# Patient Record
Sex: Male | Born: 1965 | Race: Black or African American | Hispanic: No | Marital: Married | Smoking: Former smoker
Health system: Southern US, Community
[De-identification: ages and names within clinical notes are randomized; demographics above are authoritative.]

## PROBLEM LIST (undated history)

## (undated) DIAGNOSIS — L409 Psoriasis, unspecified: Secondary | ICD-10-CM

## (undated) DIAGNOSIS — E785 Hyperlipidemia, unspecified: Secondary | ICD-10-CM

## (undated) DIAGNOSIS — E119 Type 2 diabetes mellitus without complications: Secondary | ICD-10-CM

## (undated) DIAGNOSIS — G4730 Sleep apnea, unspecified: Secondary | ICD-10-CM

## (undated) HISTORY — PX: TONSILECTOMY, ADENOIDECTOMY, BILATERAL MYRINGOTOMY AND TUBES: SHX2538

## (undated) HISTORY — DX: Hyperlipidemia, unspecified: E78.5

## (undated) HISTORY — DX: Psoriasis, unspecified: L40.9

## (undated) HISTORY — DX: Sleep apnea, unspecified: G47.30

## (undated) HISTORY — PX: HERNIA REPAIR: SHX51

---

## 2013-08-01 DIAGNOSIS — Z1211 Encounter for screening for malignant neoplasm of colon: Secondary | ICD-10-CM | POA: Insufficient documentation

## 2013-09-16 DIAGNOSIS — M5030 Other cervical disc degeneration, unspecified cervical region: Secondary | ICD-10-CM | POA: Insufficient documentation

## 2015-03-15 DIAGNOSIS — R7303 Prediabetes: Secondary | ICD-10-CM | POA: Insufficient documentation

## 2018-10-21 DIAGNOSIS — E669 Obesity, unspecified: Secondary | ICD-10-CM | POA: Insufficient documentation

## 2019-01-12 ENCOUNTER — Encounter (INDEPENDENT_AMBULATORY_CARE_PROVIDER_SITE_OTHER): Payer: Self-pay | Admitting: Nurse Practitioner

## 2019-01-12 ENCOUNTER — Ambulatory Visit (INDEPENDENT_AMBULATORY_CARE_PROVIDER_SITE_OTHER): Payer: BLUE CROSS/BLUE SHIELD | Admitting: Nurse Practitioner

## 2019-01-12 VITALS — BP 136/87 | HR 80 | Temp 97.0°F | Resp 16 | Ht 65.0 in | Wt 195.0 lb

## 2019-01-12 DIAGNOSIS — M542 Cervicalgia: Secondary | ICD-10-CM

## 2019-01-12 MED ORDER — NAPROXEN 500 MG PO TABS
500.00 mg | ORAL_TABLET | Freq: Two times a day (BID) | ORAL | 0 refills | Status: AC
Start: 2019-01-12 — End: 2019-03-13

## 2019-01-12 MED ORDER — METHOCARBAMOL 750 MG PO TABS
750.00 mg | ORAL_TABLET | Freq: Every evening | ORAL | 0 refills | Status: AC | PRN
Start: 2019-01-12 — End: 2019-01-19

## 2019-01-12 NOTE — Patient Instructions (Signed)
Dear Michael Gardner:    Thank you for choosing [facility]  [facility]  [address]  [address]   [place] [address]  [phone].        I hope your visit today was EXCELLENT.     Specific instructions for your visit today:    Follow up with your primary care physician for ongoing care.  Please go to the ER for worsening symptoms or concerns.  Please read all of your discharge instructions     You have been evaluated for a pulled muscle / spasm in left neck  You can try to use Arnica Gel , tiger balm or icy hot on the sore areas   Use the Naproxen twice daily with food for the inflammation/pain  You may use ice to the area alternating with heat  Use the muscle relaxant for the spasms - DO NOT drive, drink alcohol or work when taking this medication, it will make you very sleepy    Please follow-up with the PCP or an orthopedist if the pain continues.      Understanding Cervical Strain    There are 7 bones (vertebrae) in the neck that are part of the spine. These are called the cervical spine. Cervical strain is a medical term for neck pain. The neck has several layers of muscles. These are connected with tendons to the cervical spine and other bones. Neck pain is often the result of injury to these muscles and tendons.   Causes of cervical strain  Different types of stress on the neck can damage muscles and tendons (soft tissues) and cause cervical strain. Cervical tissues can be damaged by:    The neck being forced past its normal range of motion, such as in a car accident or sports injury   Constant, low-level stress, such as from poor posture or a poorly set-up workspace  Symptoms of cervical strain  These may include:   Neck pain or stiffness   Pain in the shoulders or upper back   Muscle spasms   Headache, often starting at the base of the neck   Irritability, trouble concentrating, or sleeplessness   Numbness in the arm or hand   Tingling or weakness in the arm   Treatment for cervical strain  This problem often gets better on its own. Treatments aim to reduce pain and inflammation and increase the range of motion of the neck. Possible treatments include:    Over-the-counter or prescription medicine. These help relieve pain and inflammation.   Muscle relaxant can help with muscle spasms.   Stretching exercises to decrease neck stiffness.   Massage to decrease neck stiffness.   Cold or heat pack. These help reduce pain and swelling.  Call 911  Call 911 right away if you have any of these:    Face drooping or numbness   Numbness or weakness, especially in the arms or on one side   Slurred speech or difficulty speaking   Blurred vision  When to call your healthcare provider  Call your healthcare provider right away if you have any of these:   Fever of 100.71F (38C) or higher, or as directed by your provider   Chills   Pain or stiffness that gets worse   Symptoms that dont get better, or get worse   Numbness, tingling, weakness or shooting pains into the arms or legs   New symptoms  [employer] last reviewed this educational content on 11/14/2017   2000-2020 The  [employer]. [address] [place], [place] [address]. All rights reserved. This information is not intended as a substitute for professional medical care. Always follow your healthcare professional's instructions.            If you do not continue to improve or your condition worsens, please contact your doctor or GO immediAtely to the Emergency Department.    Sincerely,  Janice Coffin, FNP-BC  Family Nurse Practitioner

## 2019-01-12 NOTE — Progress Notes (Signed)
[facility]  PROGRESS NOTE     Patient: Michael Gardner   Date of Service: 01/12/2019   MRN: [id number]       Praneel Haisley is a 53 y.o. male      HISTORY     Chief Complaint   Patient presents with    Neck Pain     Reports pain for the last 2 weeks, > left side, thinks may have slept wrong, Using tylenol / Tylenol with Codeine/ muscle relaxants        Patient report left sided nec pain for the last 2 weeks  Patient thinks he may have slept wrong as he denies injury or trauma to the area  Tried tylnlenol, T#3, muscle relaxants - reprots all meds were old        History provided by:  Patient  Language interpreter used: No    Neck Pain    This is a new problem. The current episode started 1 to 4 weeks ago. The problem occurs constantly. The problem has been unchanged. The pain is associated with a sleep position. The pain is present in the left side. The quality of the pain is described as aching. The pain is at a severity of 4/10. The pain is mild. Nothing aggravates the symptoms. The pain is same all the time. Pertinent negatives include no chest pain, fever, headaches, photophobia or visual change. He has tried muscle relaxants and NSAIDs for the symptoms. The treatment provided mild relief.       Review of Systems   Constitutional: Negative for chills and fever.   HENT: Negative.    Eyes: Negative.  Negative for photophobia.   Respiratory: Negative for cough, chest tightness, shortness of breath and wheezing.    Cardiovascular: Negative for chest pain and palpitations.   Gastrointestinal: Negative for abdominal pain, diarrhea, nausea and vomiting.   Endocrine: Negative.    Genitourinary: Negative.    Musculoskeletal: Positive for myalgias and neck pain.   Skin: Negative for rash.   Allergic/Immunologic: Negative for immunocompromised state.   Neurological: Negative.  Negative for headaches.   Hematological: Does not bruise/bleed easily.   Psychiatric/Behavioral: Negative.        History:  Past Medical History:    Diagnosis Date    Hyperlipidemia     Sleep apnea        Past Surgical History:   Procedure Laterality Date    TONSILECTOMY, ADENOIDECTOMY, BILATERAL MYRINGOTOMY AND TUBES      UVULECTOMY  2006       History reviewed. No pertinent family history.    Social History     Tobacco Use    Smoking status: Never Smoker    Smokeless tobacco: Never Used   Substance Use Topics    Alcohol use: Yes     Alcohol/week: 7.0 standard drinks     Types: 7 Cans of beer per week    Drug use: Never       History reviewed.        Current Outpatient Medications:     acyclovir (ZOVIRAX) 400 MG tablet, Take 400 mg by mouth, Disp: , Rfl:     aspirin EC 81 MG EC tablet, Take by mouth, Disp: , Rfl:     atorvastatin (Lipitor) 80 MG tablet, 1 tab(s), Disp: , Rfl:     fenofibrate (LOFIBRA) 160 MG tablet, Take 160 mg by mouth, Disp: , Rfl:     hydrocortisone 2.5 % cream, Apply topically, Disp: , Rfl:  ketoconazole (NIZORAL) 2 % shampoo, Use to wash scalp as directed, Disp: , Rfl:     methocarbamol (ROBAXIN) 750 MG tablet, Take 1 tablet (750 mg total) by mouth nightly as needed (spasm), Disp: 15 tablet, Rfl: 0    naproxen (NAPROSYN) 500 MG tablet, Take 1 tablet (500 mg total) by mouth 2 (two) times daily with meals, Disp: 30 tablet, Rfl: 0    No Known Allergies    Medications and Allergies reviewed.    PHYSICAL EXAM     Vitals:    01/12/19 1712   BP: 136/87   Pulse: 80   Resp: 16   Temp: 97 F (36.1 C)   SpO2: 97%   Weight: 88.5 kg (195 lb)   Height: 1.651 m (5\' 5" )       Physical Exam   Nursing note and vitals reviewed.  Constitutional: He is oriented to person, place, and time. He appears well-developed and well-nourished. No distress.   HENT:   Head: Normocephalic and atraumatic.   Eyes: Pupils are equal, round, and reactive to light.   Neck: Muscular tenderness present. No spinous process tenderness present. No neck rigidity. Decreased range of motion present. No edema present.       Cardiovascular: Normal rate, regular rhythm,  S1 normal, S2 normal and normal heart sounds. Exam reveals no gallop and no friction rub.   No murmur heard.  Pulmonary/Chest: Effort normal and breath sounds normal. No respiratory distress. He has no wheezes. He has no rhonchi. He has no rales.   Musculoskeletal:         General: Tenderness present. No deformity or edema.   Lymphadenopathy:     He has no cervical adenopathy.   Neurological: He is alert and oriented to person, place, and time.   Skin: Skin is warm and dry. No rash noted.   Psychiatric: He has a normal mood and affect. His behavior is normal. Judgment and thought content normal.         UCC COURSE       Results     ** No results found for the last 24 hours. **            No results found.      Orders Placed This Encounter   Medications    naproxen (NAPROSYN) 500 MG tablet     Sig: Take 1 tablet (500 mg total) by mouth 2 (two) times daily with meals     Dispense:  30 tablet     Refill:  0    methocarbamol (ROBAXIN) 750 MG tablet     Sig: Take 1 tablet (750 mg total) by mouth nightly as needed (spasm)     Dispense:  15 tablet     Refill:  0         PROCEDURES     Procedures       ASSESSMENT     Encounter Diagnosis   Name Primary?    Cervicalgia Yes          SSESSMENT    PLAN     Kiyon was seen today for neck pain.    Diagnoses and all orders for this visit:    Cervicalgia  -     naproxen (NAPROSYN) 500 MG tablet; Take 1 tablet (500 mg total) by mouth 2 (two) times daily with meals  -     methocarbamol (ROBAXIN) 750 MG tablet; Take 1 tablet (750 mg total) by mouth nightly as needed (spasm)  Heat   Arnica    PCP follow up as needed       Discussed results and diagnosis with patient/family.  Reviewed warning signs for worsening condition, as well as, indications for follow-up with pmd and return to urgent care clinic.   Patient/family expressed understanding of instructions.    No orders of the defined types were placed in this encounter.        An After Visit Summary was printed and given to  the patient.      Signed,  Rondall Allegra, FNP

## 2019-03-22 ENCOUNTER — Encounter (INDEPENDENT_AMBULATORY_CARE_PROVIDER_SITE_OTHER): Payer: Self-pay | Admitting: Internal Medicine

## 2019-03-22 ENCOUNTER — Ambulatory Visit (INDEPENDENT_AMBULATORY_CARE_PROVIDER_SITE_OTHER): Payer: BLUE CROSS/BLUE SHIELD | Admitting: Internal Medicine

## 2019-03-22 VITALS — BP 126/80 | HR 86 | Temp 96.9°F | Ht 65.0 in | Wt 202.2 lb

## 2019-03-22 DIAGNOSIS — Z0000 Encounter for general adult medical examination without abnormal findings: Secondary | ICD-10-CM

## 2019-03-22 DIAGNOSIS — Z23 Encounter for immunization: Secondary | ICD-10-CM

## 2019-03-22 DIAGNOSIS — E782 Mixed hyperlipidemia: Secondary | ICD-10-CM

## 2019-03-22 DIAGNOSIS — Z1211 Encounter for screening for malignant neoplasm of colon: Secondary | ICD-10-CM

## 2019-03-22 MED ORDER — ATORVASTATIN CALCIUM 80 MG PO TABS
ORAL_TABLET | ORAL | 1 refills | Status: DC
Start: 2019-03-22 — End: 2019-09-19

## 2019-03-22 MED ORDER — FENOFIBRATE 160 MG PO TABS
160.0000 mg | ORAL_TABLET | Freq: Every day | ORAL | 1 refills | Status: DC
Start: 2019-03-22 — End: 2019-09-19

## 2019-03-22 NOTE — Progress Notes (Addendum)
Subjective:       Patient ID: Michael Gardner is a 53 y.o. male.  Chief Complaint   Patient presents with    Annual Exam    Hyperlipidemia     HPI:    53 yrs old male came in for an annual physical exam. He denies chest pain, palpitations, shortness of breath, dizziness, headache or joint pains. He has no abdominal or urinary complaints. No change in bowel habits. He denies any change in activity or diet and exercises few times a week.    Hyperlipidemia  The patient is being seen for an initial evaluation of an existing diagnosis of hyperlipidemia. Hyperlipidemia is classified as elevated cholesterol with elevated triglycerides. There are no associated comorbid illnesses. There are no recent interval events. There is no interval history of myalgias, chest pain and dyspnea. Current therapy includes a statin, lifestyle changes and a fibrate.  Patient is compliant with the current regimen. The LDL is at goal, with medication.  The HDL is above goal, with medication. The triglycerides are above goal, with medication. Lifestyle changes have included improved nutrition.  The patient reports exercising 30 min/day, 3-4 days/week.  The patient's exercise routine consists of walking.        Past Medical History:   Diagnosis Date    Hyperlipidemia     Sleep apnea        Past Surgical History:   Procedure Laterality Date    TONSILECTOMY, ADENOIDECTOMY, BILATERAL MYRINGOTOMY AND TUBES      UVULECTOMY  2006       Family History   Problem Relation Age of Onset    No known problems Mother     COPD Father        Social History     Socioeconomic History    Marital status: Married     Spouse name: Not on file    Number of children: Not on file    Years of education: Not on file    Highest education level: Not on file   Occupational History    Not on file   Social Needs    Financial resource strain: Not on file    Food insecurity     Worry: Not on file     Inability: Not on file    Transportation needs     Medical: Not on  file     Non-medical: Not on file   Tobacco Use    Smoking status: Never Smoker    Smokeless tobacco: Never Used   Substance and Sexual Activity    Alcohol use: Yes     Alcohol/week: 7.0 standard drinks     Types: 7 Cans of beer per week    Drug use: Never    Sexual activity: Not on file   Lifestyle    Physical activity     Days per week: Not on file     Minutes per session: Not on file    Stress: Not on file   Relationships    Social connections     Talks on phone: Not on file     Gets together: Not on file     Attends religious service: Not on file     Active member of club or organization: Not on file     Attends meetings of clubs or organizations: Not on file     Relationship status: Not on file    Intimate partner violence     Fear of current or ex partner: Not  on file     Emotionally abused: Not on file     Physically abused: Not on file     Forced sexual activity: Not on file   Other Topics Concern    Not on file   Social History Narrative    Not on file       No Known Allergies    Current Outpatient Medications   Medication Sig Dispense Refill    aspirin EC 81 MG EC tablet Take by mouth      atorvastatin (Lipitor) 80 MG tablet 1 tab(s) 90 tablet 1    fenofibrate (LOFIBRA) 160 MG tablet Take 1 tablet (160 mg total) by mouth daily 90 tablet 1    hydrocortisone 2.5 % cream Apply topically       No current facility-administered medications for this visit.          Review of Systems  Constitutional: Negative for activity change, appetite change, fatigue and unexpected weight change.   HENT: Negative.    Eyes: Negative.    Respiratory: Negative for chest tightness, shortness of breath and wheezing.    Cardiovascular: Negative for chest pain and palpitations.   Gastrointestinal: Negative for nausea, vomiting, abdominal pain, constipation and blood in stool.   Genitourinary: Negative.    Musculoskeletal: Negative for myalgias, joint swelling and arthralgias.   Skin: Negative.    Neurological:  Negative.    Vitals:    03/22/19 0820   BP: 126/80   BP Site: Left arm   Patient Position: Sitting   Cuff Size: Large   Pulse: 86   Temp: (!) 96.9 F (36.1 C)   TempSrc: Temporal   Weight: 91.7 kg (202 lb 3.2 oz)   Height: 1.651 m (5\' 5" )           Objective:    Physical Exam  Constitutional: Patient is oriented to person, place, and time and appears well-developed and well-nourished.   HENT: Head: Normocephalic.        Right Ear: External ear normal.        Left Ear: External ear normal.        Mouth/Throat: No pharyngeal erythema, oral ulcers or pharyngeal exudate.   Eyes: Conjunctivae normal and EOM are normal. Pupils are equal, round, and reactive to light. No scleral icterus.   Neck: Normal range of motion. Neck supple. No JVD present. No thyromegaly present.   Cardiovascular: Normal rate, regular rhythm, normal heart sounds and intact distal pulses.                             Exam reveals no gallop. No murmur heard.  Pulmonary/Chest: Effort normal and breath sounds normal. No wheezes and no rales.   Abdominal: Soft. Bowel sounds are normal, no distension.                     There is no tenderness. No hepatospleenomegaly   Genitourinary:  Testis / Penis - normal.   Musculoskeletal: No edema feet.           Normal range of motion of all joints. No joint swelling noted.  Lymphadenopathy:            No cervical, axillary or inguinal lymphadenopathy.   Neurological: Alert and oriented to person, place, and time. No cranial nerve deficit.  Normal muscle tone & coordination. No apparent sensory loss.   Skin: Skin is warm and dry. No rash noted. No erythema.        Assessment:       1. ANNUAL PHYSICAL EXAM - ECG : Normal Sinus Rhythm, Within Normal Limits  2. Mixed Hyperlipidemia  3. Need for Flu Vaccine  4. Colon Cancer Screening      Plan:       1. Normal Physical Exam.   Labs: CBC, CMP, Lipids, HbA1c, TSH - sent.  2. Discussed healthy diet with more of vegetables, fruits and nuts and  emphasized regular exercise 40-45 minutes / day at least 4-5 /week.  3. Vaccination status - Flu Vaccine given.  4. Suggest Vit D 2000 units daily.  5. Needs colonoscopy - referral given  6. Continue Lipitor 80 mg daily HS and Fenofibrate 160 mg daily  7. May need to reduce the dose of Lipitor to 40 mg if LDL is low                      Procedures

## 2019-03-22 NOTE — Progress Notes (Signed)
Have you seen any specialists/other providers since your last visit with us?    No    Arm preference verified?   Yes    The patient is due for nothing at this time, HM is up-to-date.

## 2019-03-23 ENCOUNTER — Telehealth: Payer: Self-pay

## 2019-03-23 ENCOUNTER — Ambulatory Visit (INDEPENDENT_AMBULATORY_CARE_PROVIDER_SITE_OTHER): Payer: BLUE CROSS/BLUE SHIELD | Admitting: Internal Medicine

## 2019-03-23 LAB — CBC AND DIFFERENTIAL
Baso(Absolute): 0 10*3/uL (ref 0.0–0.2)
Basos: 0 %
Eos: 7 %
Eosinophils Absolute: 0.3 10*3/uL (ref 0.0–0.4)
Hematocrit: 44.2 % (ref 37.5–51.0)
Hemoglobin: 14.8 g/dL (ref 13.0–17.7)
Immature Granulocytes Absolute: 0 10*3/uL (ref 0.0–0.1)
Immature Granulocytes: 1 %
Lymphocytes Absolute: 1.4 10*3/uL (ref 0.7–3.1)
Lymphocytes: 37 %
MCH: 26.6 pg (ref 26.6–33.0)
MCHC: 33.5 g/dL (ref 31.5–35.7)
MCV: 79 fL (ref 79–97)
Monocytes Absolute: 0.3 10*3/uL (ref 0.1–0.9)
Monocytes: 9 %
Neutrophils Absolute: 1.8 10*3/uL (ref 1.4–7.0)
Neutrophils: 46 %
Platelets: 215 10*3/uL (ref 150–450)
RBC: 5.57 x10E6/uL (ref 4.14–5.80)
RDW: 15.7 % — ABNORMAL HIGH (ref 11.6–15.4)
WBC: 3.8 10*3/uL (ref 3.4–10.8)

## 2019-03-23 LAB — LIPID PANEL
Cholesterol / HDL Ratio: 23.8 ratio — ABNORMAL HIGH (ref 0.0–5.0)
Cholesterol: 333 mg/dL — ABNORMAL HIGH (ref 100–199)
HDL: 14 mg/dL — ABNORMAL LOW (ref >39)
Triglycerides: 2042 mg/dL (ref 0–149)

## 2019-03-23 LAB — COMPREHENSIVE METABOLIC PANEL
ALT: 23 IU/L (ref 0–44)
AST (SGOT): 29 IU/L (ref 0–40)
Albumin/Globulin Ratio: 1.3 (ref 1.2–2.2)
Albumin: 4.1 g/dL (ref 3.8–4.9)
Alkaline Phosphatase: 72 IU/L (ref 39–117)
BUN / Creatinine Ratio: 13 (ref 9–20)
BUN: 15 mg/dL (ref 6–24)
Bilirubin, Total: 0.3 mg/dL (ref 0.0–1.2)
CO2: 20 mmol/L (ref 20–29)
Calcium: 9.2 mg/dL (ref 8.7–10.2)
Chloride: 97 mmol/L (ref 96–106)
Creatinine: 1.19 mg/dL (ref 0.76–1.27)
EGFR: 70 mL/min/{1.73_m2} (ref >59)
EGFR: 81 mL/min/{1.73_m2} (ref >59)
Globulin, Total: 3.1 g/dL (ref 1.5–4.5)
Glucose: 97 mg/dL (ref 65–99)
Potassium: 4.2 mmol/L (ref 3.5–5.2)
Protein, Total: 7.2 g/dL (ref 6.0–8.5)
Sodium: 136 mmol/L (ref 134–144)

## 2019-03-23 LAB — HEMOGLOBIN A1C: Hemoglobin A1C: 5.9 % — ABNORMAL HIGH (ref 4.8–5.6)

## 2019-03-23 LAB — TSH: TSH: 0.745 u[IU]/mL (ref 0.450–4.500)

## 2019-03-23 NOTE — Telephone Encounter (Signed)
Dr Brent Bulla spoke with patient regarding lab results.

## 2019-03-23 NOTE — Telephone Encounter (Signed)
Patient is returning call from Dr. Brent Bulla. Please return call at noted phone number below.      Patient Preferred Callback Number: [phone]

## 2019-07-01 ENCOUNTER — Encounter (INDEPENDENT_AMBULATORY_CARE_PROVIDER_SITE_OTHER): Payer: Self-pay | Admitting: Internal Medicine

## 2019-07-01 ENCOUNTER — Ambulatory Visit (INDEPENDENT_AMBULATORY_CARE_PROVIDER_SITE_OTHER): Payer: BLUE CROSS/BLUE SHIELD | Admitting: Internal Medicine

## 2019-07-01 VITALS — BP 125/80 | HR 78 | Temp 97.3°F | Ht 65.0 in | Wt 199.0 lb

## 2019-07-01 DIAGNOSIS — K429 Umbilical hernia without obstruction or gangrene: Secondary | ICD-10-CM

## 2019-07-01 DIAGNOSIS — E781 Pure hyperglyceridemia: Secondary | ICD-10-CM

## 2019-07-01 DIAGNOSIS — Z23 Encounter for immunization: Secondary | ICD-10-CM

## 2019-07-01 DIAGNOSIS — R7303 Prediabetes: Secondary | ICD-10-CM

## 2019-07-01 NOTE — Progress Notes (Signed)
Have you seen any specialists/other providers since your last visit with us?    No    Arm preference verified?   Yes    The patient is due for shingles vaccine and Advance Directive

## 2019-07-01 NOTE — Progress Notes (Signed)
Subjective:       Patient ID: Michael Gardner is a 54 y.o. male.  Chief Complaint   Patient presents with    Hyperlipidemia     follow up visit        Hyperlipidemia  The patient is being seen for a routine follow-up of hyperlipidemia. Hyperlipidemia is classified as. Hypertriglyceridemia  Comorbid illnesses include obesity.  There are no recent interval events. There is no interval history of myalgias, chest pain and dyspnea. Current therapy includes a statin, lifestyle changes and a fibrate.  The LDL is above goal.  The HDL is at goal. The triglycerides are above goal. Lifestyle changes have included improved nutrition and increased exercise.  The patient reports exercising 45 min/day, 5-7 days/week.  The patient's exercise routine consists of walking.    He had elevated A1c last time. He denies polyuria, thirst, fatigue, leg aches or weight loss. He has been on low carbohydrate diet for last 3 months    The following portions of the patient's history were reviewed and updated as appropriate: allergies, current medications, past family history, past medical history, past social history, past surgical history and problem list.    Review of Systems   Constitutional: Negative for fatigue and unexpected weight change.   Respiratory: Negative for shortness of breath.    Cardiovascular: Negative for chest pain and leg swelling.   Musculoskeletal: Negative for arthralgias and myalgias.     Vitals:    07/01/19 0817   BP: 125/80   BP Site: Left arm   Patient Position: Sitting   Cuff Size: Large   Pulse: 78   Temp: 97.3 F (36.3 C)   TempSrc: Temporal   Weight: 90.3 kg (199 lb)   Height: 1.651 m (5\' 5" )             Objective:    Physical Exam  Vitals signs reviewed.   Constitutional:       General: He is not in acute distress.     Appearance: Normal appearance.   Cardiovascular:      Rate and Rhythm: Normal rate and regular rhythm.      Heart sounds: Normal heart sounds. No murmur. No gallop.    Pulmonary:      Effort:  Pulmonary effort is normal.      Breath sounds: Normal breath sounds. No wheezing or rales.   Abdominal:      General: Bowel sounds are normal. There is no distension.      Palpations: Abdomen is soft.      Tenderness: There is no abdominal tenderness.      Comments: Small reducible umbilical hernia with divarication of Recti noted.               Assessment:       1. Hypertriglyceridemia.  2. Prediabetes  3. Umbilical Hernia  4. Need for Herpes Zoster Vaccine      Plan:       1. Lipitor 80 mg HS and Fenofibrate 160 mg daily - continue.  2. Strict low fat diet, no fired foods. Reduce sugar and sweets.  3. Regular aerobic exercise  4. Need to see Gen. Surgery - for hernia repair - referral given.  5. Labs: CBC, CMP, Lipids and HbA1c - sent  6. Herpes Zoster vaccine first dose given                              Procedures

## 2019-07-02 LAB — CBC AND DIFFERENTIAL
Baso(Absolute): 0 10*3/uL (ref 0.0–0.2)
Basos: 1 %
Eos: 6 %
Eosinophils Absolute: 0.3 10*3/uL (ref 0.0–0.4)
Hematocrit: 47 % (ref 37.5–51.0)
Hemoglobin: 14.6 g/dL (ref 13.0–17.7)
Immature Granulocytes Absolute: 0 10*3/uL (ref 0.0–0.1)
Immature Granulocytes: 0 %
Lymphocytes Absolute: 1.8 10*3/uL (ref 0.7–3.1)
Lymphocytes: 46 %
MCH: 25 pg — ABNORMAL LOW (ref 26.6–33.0)
MCHC: 31.1 g/dL — ABNORMAL LOW (ref 31.5–35.7)
MCV: 80 fL (ref 79–97)
Monocytes Absolute: 0.4 10*3/uL (ref 0.1–0.9)
Monocytes: 9 %
Neutrophils Absolute: 1.5 10*3/uL (ref 1.4–7.0)
Neutrophils: 38 %
Platelets: 183 10*3/uL (ref 150–450)
RBC: 5.85 x10E6/uL — ABNORMAL HIGH (ref 4.14–5.80)
RDW: 15.7 % — ABNORMAL HIGH (ref 11.6–15.4)
WBC: 3.9 10*3/uL (ref 3.4–10.8)

## 2019-07-02 LAB — LIPID PANEL
Cholesterol / HDL Ratio: 7.6 ratio — ABNORMAL HIGH (ref 0.0–5.0)
Cholesterol: 152 mg/dL (ref 100–199)
HDL: 20 mg/dL — ABNORMAL LOW (ref >39)
LDL Chol Calculated (NIH): 45 mg/dL (ref 0–99)
Triglycerides: 609 mg/dL (ref 0–149)
VLDL Calculated: 87 mg/dL — ABNORMAL HIGH (ref 5–40)

## 2019-07-02 LAB — COMPREHENSIVE METABOLIC PANEL
ALT: 25 IU/L (ref 0–44)
AST (SGOT): 23 IU/L (ref 0–40)
Albumin/Globulin Ratio: 1.5 (ref 1.2–2.2)
Albumin: 4.4 g/dL (ref 3.8–4.9)
Alkaline Phosphatase: 77 IU/L (ref 39–117)
BUN / Creatinine Ratio: 13 (ref 9–20)
BUN: 15 mg/dL (ref 6–24)
Bilirubin, Total: 0.4 mg/dL (ref 0.0–1.2)
CO2: 24 mmol/L (ref 20–29)
Calcium: 9.6 mg/dL (ref 8.7–10.2)
Chloride: 104 mmol/L (ref 96–106)
Creatinine: 1.16 mg/dL (ref 0.76–1.27)
EGFR: 71 mL/min/{1.73_m2} (ref >59)
EGFR: 83 mL/min/{1.73_m2} (ref >59)
Globulin, Total: 2.9 g/dL (ref 1.5–4.5)
Glucose: 105 mg/dL — ABNORMAL HIGH (ref 65–99)
Potassium: 4.4 mmol/L (ref 3.5–5.2)
Protein, Total: 7.3 g/dL (ref 6.0–8.5)
Sodium: 142 mmol/L (ref 134–144)

## 2019-07-02 LAB — HEMOGLOBIN A1C: Hemoglobin A1C: 5.8 % — ABNORMAL HIGH (ref 4.8–5.6)

## 2019-07-04 ENCOUNTER — Encounter (INDEPENDENT_AMBULATORY_CARE_PROVIDER_SITE_OTHER): Payer: Self-pay | Admitting: Internal Medicine

## 2019-07-20 ENCOUNTER — Encounter (INDEPENDENT_AMBULATORY_CARE_PROVIDER_SITE_OTHER): Payer: Self-pay

## 2019-07-28 ENCOUNTER — Encounter (INDEPENDENT_AMBULATORY_CARE_PROVIDER_SITE_OTHER): Payer: Self-pay

## 2019-07-28 ENCOUNTER — Encounter (INDEPENDENT_AMBULATORY_CARE_PROVIDER_SITE_OTHER): Payer: Self-pay | Admitting: Internal Medicine

## 2019-07-29 ENCOUNTER — Encounter (INDEPENDENT_AMBULATORY_CARE_PROVIDER_SITE_OTHER): Payer: Self-pay

## 2019-08-04 ENCOUNTER — Encounter (INDEPENDENT_AMBULATORY_CARE_PROVIDER_SITE_OTHER): Payer: Self-pay

## 2019-08-24 ENCOUNTER — Encounter (INDEPENDENT_AMBULATORY_CARE_PROVIDER_SITE_OTHER): Payer: Self-pay

## 2019-08-25 ENCOUNTER — Encounter (INDEPENDENT_AMBULATORY_CARE_PROVIDER_SITE_OTHER): Payer: Self-pay

## 2019-08-30 ENCOUNTER — Ambulatory Visit (INDEPENDENT_AMBULATORY_CARE_PROVIDER_SITE_OTHER): Payer: BLUE CROSS/BLUE SHIELD

## 2019-08-30 DIAGNOSIS — Z23 Encounter for immunization: Secondary | ICD-10-CM

## 2019-08-30 NOTE — Progress Notes (Signed)
COVID-19 Vaccination:  Michael Gardner   [birth year]  08/30/2019     Patient presented to the office for the following COVID-19 vaccine administration:    [x]  Jansen/J+J (single dose)  []  Pfizer-BioNTech  []  Moderna      []  Dose #1   []  Dose #2   (Both doses of the series should be completed with the same product.)    [x]  A verbal consent has been obtained from patient to administer COVID-19 vaccine that is currently being offered under Emergency Use Authorization (EUA). Patient/Caregiver has been provided a copy of EUA.      [x]  Pt has not received other non-COVID vaccinations within 14 days of administration of COVID-19 vaccination.  [x]  Pt was screened for precautions and contraindications to vaccine  []  24-28+ day interval observed between first and second dose (Moderna)  []  17-21+ day interval observed between first and second dose (Pfizer-BioNTech)

## 2019-08-31 ENCOUNTER — Ambulatory Visit (INDEPENDENT_AMBULATORY_CARE_PROVIDER_SITE_OTHER): Payer: BLUE CROSS/BLUE SHIELD | Admitting: Internal Medicine

## 2019-09-01 ENCOUNTER — Encounter (INDEPENDENT_AMBULATORY_CARE_PROVIDER_SITE_OTHER): Payer: Self-pay

## 2019-09-18 ENCOUNTER — Other Ambulatory Visit (INDEPENDENT_AMBULATORY_CARE_PROVIDER_SITE_OTHER): Payer: Self-pay | Admitting: Internal Medicine

## 2019-09-18 DIAGNOSIS — E782 Mixed hyperlipidemia: Secondary | ICD-10-CM

## 2019-10-21 ENCOUNTER — Encounter (INDEPENDENT_AMBULATORY_CARE_PROVIDER_SITE_OTHER): Payer: Self-pay | Admitting: Internal Medicine

## 2019-10-24 ENCOUNTER — Ambulatory Visit (INDEPENDENT_AMBULATORY_CARE_PROVIDER_SITE_OTHER): Payer: BLUE CROSS/BLUE SHIELD | Admitting: Nurse Practitioner

## 2019-10-24 ENCOUNTER — Encounter (INDEPENDENT_AMBULATORY_CARE_PROVIDER_SITE_OTHER): Payer: Self-pay | Admitting: Nurse Practitioner

## 2019-10-24 VITALS — BP 120/83 | HR 104 | Temp 97.8°F | Wt 198.2 lb

## 2019-10-24 DIAGNOSIS — R7303 Prediabetes: Secondary | ICD-10-CM

## 2019-10-24 DIAGNOSIS — R000 Tachycardia, unspecified: Secondary | ICD-10-CM

## 2019-10-24 NOTE — Progress Notes (Signed)
Subjective:      Patient ID: Michael Gardner is a 54 y.o. male.    Chief Complaint:  Chief Complaint   Patient presents with    Palpitations     headache x1 week       HPI:  Pt reports this am he woke up this morning to do the treadmill. Pt had exercised one time in the last 5 months.  Pt started to run for two minutes at 4.5 speed with no elevation and heart rate elevated to 170 using handles on the treadmill. Pt stayed on the treadmill and slowed down, and heart rate decreased 107-110. Pt got off treadmill took a shower and had a fruit smoothy and took heart rate on pulse ox and heart rate fluctated.  Pt reports that he has slight headaches, head feels "tight", for the last couple months, improves slightly when taking allergy medication.    Pt reports that he was drinking water while exercising. Pt had not had any caffeine prior to exercise. Pt has been taking sudafed daily for allergies in addition to allegra  Pt reports that he has been getting up two times per night to urinate which is an increase in frequency.     Reviewed labs from 06/2019, pt had an HA1C 5.8  Vitals (Trend):  BP Readings from Last 3 Encounters:  10/24/19 : 120/83  07/01/19 : 125/80  03/22/19 : 126/80     Pulse Readings from Last 3 Encounters:  10/24/19 : (!) 104  07/01/19 : 78  03/22/19 : 86     Wt Readings from Last 3 Encounters:  10/24/19 : 89.9 kg (198 lb 3.2 oz)  07/01/19 : 90.3 kg (199 lb)  03/22/19 : 91.7 kg (202 lb 3.2 oz)   Hemoglobin A1C       Date                     Value               Ref Range           Status                07/01/2019               5.8 (H)             4.8 - 5.6 %         Final              Comment:             Prediabetes: 5.7 - 6.4           Diabetes: >6.4           Glycemic control for adults with diabetes: <7.0         03/22/2019               5.9 (H)             4.8 - 5.6 %         Final              Comment:             Prediabetes: 5.7 - 6.4           Diabetes: >6.4           Glycemic control for adults with  diabetes: <7.0    ----------)      Problem List:  Patient Active Problem  List   Diagnosis    Degeneration of intervertebral disc of cervical region    Obesity, unspecified    Obstructive sleep apnea    Prediabetes    Screening for colon cancer    Umbilical hernia       Current Medications:  Current Outpatient Medications   Medication Sig Dispense Refill    aspirin EC 81 MG EC tablet Take by mouth      atorvastatin (LIPITOR) 80 MG tablet TAKE 1 TABLET BY MOUTH EVERY DAY 90 tablet 0    fenofibrate (LOFIBRA) 160 MG tablet TAKE 1 TABLET BY MOUTH EVERY DAY 90 tablet 0     No current facility-administered medications for this visit.       Allergies:  No Known Allergies    Past Medical History:  Past Medical History:   Diagnosis Date    Hyperlipidemia     Sleep apnea        Past Surgical History:  Past Surgical History:   Procedure Laterality Date    TONSILECTOMY, ADENOIDECTOMY, BILATERAL MYRINGOTOMY AND TUBES      UVULECTOMY  2006       Family History:  Family History   Problem Relation Age of Onset    No known problems Mother     COPD Father        Social History:  Social History     Socioeconomic History    Marital status: Married     Spouse name: Not on file    Number of children: Not on file    Years of education: Not on file    Highest education level: Not on file   Occupational History    Not on file   Tobacco Use    Smoking status: Never Smoker    Smokeless tobacco: Never Used   Substance and Sexual Activity    Alcohol use: Yes     Alcohol/week: 7.0 standard drinks     Types: 7 Cans of beer per week    Drug use: Never    Sexual activity: Yes   Other Topics Concern    Not on file   Social History Narrative    Not on file     Social Determinants of Health     Financial Resource Strain:     Difficulty of Paying Living Expenses:    Food Insecurity:     Worried About Programme researcher, broadcasting/film/video in the Last Year:     Barista in the Last Year:    Transportation Needs:     Automotive engineer (Medical):     Lack of Transportation (Non-Medical):    Physical Activity:     Days of Exercise per Week:     Minutes of Exercise per Session:    Stress:     Feeling of Stress :    Social Connections:     Frequency of Communication with Friends and Family:     Frequency of Social Gatherings with Friends and Family:     Attends Religious Services:     Active Member of Clubs or Organizations:     Attends Banker Meetings:     Marital Status:    Intimate Partner Violence:     Fear of Current or Ex-Partner:     Emotionally Abused:     Physically Abused:     Sexually Abused:        The following sections were reviewed this encounter by the provider:   Allergies  Meds   Problems   Med Hx   Surg Hx   Fam Hx          ROS:  Review of Systems   Constitutional: Positive for unexpected weight change (unexpected weight gain). Negative for fatigue.   Cardiovascular: Negative for chest pain, palpitations and leg swelling.        Elevated heart rate during   Endocrine: Positive for polydipsia and polyuria.   Genitourinary: Positive for frequency.   Allergic/Immunologic: Positive for environmental allergies.   Neurological: Positive for headaches. Negative for dizziness, weakness, light-headedness and numbness.       Vitals:  BP 120/83 (BP Site: Right arm, Patient Position: Sitting, Cuff Size: Large)    Pulse (!) 104    Temp 97.8 F (36.6 C) (Oral)    Wt 89.9 kg (198 lb 3.2 oz)    SpO2 95%    BMI 32.98 kg/m      Objective:     Physical Exam:  Physical Exam  Constitutional:       General: He is not in acute distress.     Appearance: Normal appearance. He is not ill-appearing, toxic-appearing or diaphoretic.   HENT:      Head: Normocephalic and atraumatic.   Eyes:      General: No scleral icterus.     Extraocular Movements: Extraocular movements intact.      Conjunctiva/sclera: Conjunctivae normal.      Comments: Extraocular eye movements normal   Cardiovascular:      Rate and Rhythm:  Regular rhythm. Tachycardia present.      Pulses: Normal pulses.   Pulmonary:      Effort: Pulmonary effort is normal. No respiratory distress.      Breath sounds: Normal breath sounds.   Abdominal:      Tenderness: There is no right CVA tenderness or left CVA tenderness.   Musculoskeletal:      Cervical back: Normal range of motion.   Skin:     General: Skin is warm and dry.      Capillary Refill: Capillary refill takes less than 2 seconds.      Coloration: Skin is not pale.   Neurological:      Mental Status: He is alert and oriented to person, place, and time.   Psychiatric:         Mood and Affect: Mood normal.         Behavior: Behavior normal.          Assessment:     1. Tachycardia  - ECG 12 lead  - B-type Natriuretic Peptide  - CBC and differential  - Comprehensive metabolic panel  - Magnesium  - C Reactive Protein  - Hemoglobin A1C  - T4, free  - T3, free  - TSH  - Ferritin  - IRON PROFILE    2. Prediabetes  - Hemoglobin A1C      Plan:     Discussed possible causes of elevated heart rate to include over exertion, dehydration, anemia, hyperthyroid.  Pt advised to not take sudafed daily as it can elevated heart rate and bp  Discussed results of ECG with pt NSR  Pt with h/o prediabetes, due to increase urine frequency with recheck.  If prediabetic pt would like to start low dose of metformin  I have reviewed the diagnosis and diff dx with pt (and parent).  Treatment of chief complaint has been discussed to include both medical and home treatment. Pt notified of signs and symptoms of  when to follow up with PCP or UC; pt notified of when to go to ER if applicable.  Handout given which discusses reviews the patients diagnosis, treatment, and need for follow up      Leonarda Salon, DNP FNP

## 2019-10-24 NOTE — Progress Notes (Signed)
Have you seen any specialists/other providers since your last visit with us?    No    Arm preference verified?   Yes    The patient is due for colonoscopy and shingles vaccine

## 2019-10-26 ENCOUNTER — Encounter (INDEPENDENT_AMBULATORY_CARE_PROVIDER_SITE_OTHER): Payer: Self-pay | Admitting: Nurse Practitioner

## 2019-10-26 ENCOUNTER — Other Ambulatory Visit (INDEPENDENT_AMBULATORY_CARE_PROVIDER_SITE_OTHER): Payer: Self-pay | Admitting: Nurse Practitioner

## 2019-10-26 DIAGNOSIS — R7303 Prediabetes: Secondary | ICD-10-CM

## 2019-10-26 DIAGNOSIS — E782 Mixed hyperlipidemia: Secondary | ICD-10-CM

## 2019-10-26 LAB — CBC AND DIFFERENTIAL
Baso(Absolute): 0 10*3/uL (ref 0.0–0.2)
Basos: 1 %
Eos: 7 %
Eosinophils Absolute: 0.3 10*3/uL (ref 0.0–0.4)
Hematocrit: 51.3 % — ABNORMAL HIGH (ref 37.5–51.0)
Hemoglobin: 15.8 g/dL (ref 13.0–17.7)
Immature Granulocytes Absolute: 0 10*3/uL (ref 0.0–0.1)
Immature Granulocytes: 0 %
Lymphocytes Absolute: 1.9 10*3/uL (ref 0.7–3.1)
Lymphocytes: 46 %
MCH: 25.6 pg — ABNORMAL LOW (ref 26.6–33.0)
MCHC: 30.8 g/dL — ABNORMAL LOW (ref 31.5–35.7)
MCV: 83 fL (ref 79–97)
Monocytes Absolute: 0.4 10*3/uL (ref 0.1–0.9)
Monocytes: 9 %
Neutrophils Absolute: 1.6 10*3/uL (ref 1.4–7.0)
Neutrophils: 37 %
Platelets: 215 10*3/uL (ref 150–450)
RBC: 6.17 x10E6/uL — ABNORMAL HIGH (ref 4.14–5.80)
RDW: 17 % — ABNORMAL HIGH (ref 11.6–15.4)
WBC: 4.2 10*3/uL (ref 3.4–10.8)

## 2019-10-26 LAB — COMPREHENSIVE METABOLIC PANEL
ALT: 23 IU/L (ref 0–44)
AST (SGOT): 25 IU/L (ref 0–40)
African American eGFR: 80 mL/min/{1.73_m2} (ref >59)
Albumin/Globulin Ratio: 1.5 (ref 1.2–2.2)
Albumin: 4.7 g/dL (ref 3.8–4.9)
Alkaline Phosphatase: 82 IU/L (ref 39–117)
BUN / Creatinine Ratio: 11 (ref 9–20)
BUN: 13 mg/dL (ref 6–24)
Bilirubin, Total: 0.4 mg/dL (ref 0.0–1.2)
CO2: 20 mmol/L (ref 20–29)
Calcium: 10 mg/dL (ref 8.7–10.2)
Chloride: 102 mmol/L (ref 96–106)
Creatinine: 1.19 mg/dL (ref 0.76–1.27)
Globulin, Total: 3.2 g/dL (ref 1.5–4.5)
Glucose: 104 mg/dL — ABNORMAL HIGH (ref 65–99)
Potassium: 4.5 mmol/L (ref 3.5–5.2)
Protein, Total: 7.9 g/dL (ref 6.0–8.5)
Sodium: 139 mmol/L (ref 134–144)
non-African American eGFR: 69 mL/min/{1.73_m2} (ref >59)

## 2019-10-26 LAB — HEMOGLOBIN A1C: Hemoglobin A1C: 6.4 % — ABNORMAL HIGH (ref 4.8–5.6)

## 2019-10-26 LAB — IRON PROFILE
Iron Saturation: 22 % (ref 15–55)
Iron: 85 ug/dL (ref 38–169)
TIBC: 390 ug/dL (ref 250–450)
UIBC: 305 ug/dL (ref 111–343)

## 2019-10-26 LAB — MAGNESIUM: Magnesium: 2.1 mg/dL (ref 1.6–2.3)

## 2019-10-26 LAB — FERRITIN: Ferritin: 69 ng/mL (ref 30–400)

## 2019-10-26 LAB — T3, FREE: T3, Free: 3.6 pg/mL (ref 2.0–4.4)

## 2019-10-26 LAB — T4, FREE: T4, Free: 1.3 ng/dL (ref 0.82–1.77)

## 2019-10-26 LAB — TSH: TSH: 0.694 u[IU]/mL (ref 0.450–4.500)

## 2019-10-26 LAB — C-REACTIVE PROTEIN: C-Reactive Protein: 1 mg/L (ref 0–10)

## 2019-10-26 MED ORDER — METFORMIN HCL 500 MG PO TABS
500.0000 mg | ORAL_TABLET | Freq: Every morning | ORAL | 1 refills | Status: DC
Start: 2019-10-26 — End: 2020-01-26

## 2019-10-26 NOTE — Progress Notes (Signed)
Hi Cole, the cardiac reactive protein is normal.  The additional thyroid value is normal.  The iron store is normal.  There is one more cardiac marker pending.  I will keep you posted, Sherron Monday  PS I also sent you a message in regards to metformin so check messages :)

## 2019-10-26 NOTE — Progress Notes (Signed)
Hello Michael Gardner, the cbc indicates no current evidence of infection.  The iron stores are normal, but the ferritin level is still pending. The thyroid value is normal.  The HA1C is 6.4, 6.5 is diabetes so I do not want you to continue in that direction.  Therefore, I would like you to restart the metformin.  Do you need a refill? The cmp indicates a slightly elevated fasting glucose, normal electrolytes and normal liver and kidney function. The magnesium level is normal. There are still  a few labs pending, I will keep you posted.  Have a nice day, Jerrye Bushy

## 2019-10-27 ENCOUNTER — Encounter (INDEPENDENT_AMBULATORY_CARE_PROVIDER_SITE_OTHER): Payer: Self-pay | Admitting: Nurse Practitioner

## 2019-10-27 LAB — B-TYPE NATRIURETIC PEPTIDE: B-Natriuretic Peptide: <2.5 pg/mL (ref 0.0–100.0)

## 2019-10-27 NOTE — Progress Notes (Signed)
Hello Michael Gardner, the additional cardiac marker is normal. Please let me know if symtoms reoccur as I would want to refer you to cardiology if this occurs.  Have a nice day, Jerrye Bushy

## 2019-12-26 ENCOUNTER — Other Ambulatory Visit (INDEPENDENT_AMBULATORY_CARE_PROVIDER_SITE_OTHER): Payer: Self-pay | Admitting: Internal Medicine

## 2019-12-26 DIAGNOSIS — E782 Mixed hyperlipidemia: Secondary | ICD-10-CM

## 2020-01-15 ENCOUNTER — Other Ambulatory Visit (INDEPENDENT_AMBULATORY_CARE_PROVIDER_SITE_OTHER): Payer: Self-pay | Admitting: Internal Medicine

## 2020-01-15 DIAGNOSIS — E782 Mixed hyperlipidemia: Secondary | ICD-10-CM

## 2020-01-20 ENCOUNTER — Encounter (INDEPENDENT_AMBULATORY_CARE_PROVIDER_SITE_OTHER): Payer: Self-pay | Admitting: Internal Medicine

## 2020-01-20 ENCOUNTER — Ambulatory Visit (INDEPENDENT_AMBULATORY_CARE_PROVIDER_SITE_OTHER): Payer: BLUE CROSS/BLUE SHIELD | Admitting: Internal Medicine

## 2020-01-20 VITALS — BP 130/85 | HR 67 | Temp 97.1°F | Ht 64.0 in | Wt 207.0 lb

## 2020-01-20 DIAGNOSIS — Z1211 Encounter for screening for malignant neoplasm of colon: Secondary | ICD-10-CM

## 2020-01-20 DIAGNOSIS — L989 Disorder of the skin and subcutaneous tissue, unspecified: Secondary | ICD-10-CM

## 2020-01-20 DIAGNOSIS — Z125 Encounter for screening for malignant neoplasm of prostate: Secondary | ICD-10-CM

## 2020-01-20 DIAGNOSIS — E785 Hyperlipidemia, unspecified: Secondary | ICD-10-CM

## 2020-01-20 DIAGNOSIS — J310 Chronic rhinitis: Secondary | ICD-10-CM

## 2020-01-20 DIAGNOSIS — R7303 Prediabetes: Secondary | ICD-10-CM

## 2020-01-20 DIAGNOSIS — Z23 Encounter for immunization: Secondary | ICD-10-CM

## 2020-01-20 DIAGNOSIS — H109 Unspecified conjunctivitis: Secondary | ICD-10-CM

## 2020-01-20 MED ORDER — CETIRIZINE HCL 10 MG PO TABS
10.0000 mg | ORAL_TABLET | Freq: Every day | ORAL | 3 refills | Status: AC
Start: 2020-01-20 — End: ?

## 2020-01-20 MED ORDER — CLOBETASOL PROPIONATE 0.05 % EX CREA
TOPICAL_CREAM | Freq: Every day | CUTANEOUS | 1 refills | Status: DC
Start: 2020-01-20 — End: 2022-11-12

## 2020-01-20 MED ORDER — FENOFIBRATE 160 MG PO TABS
160.0000 mg | ORAL_TABLET | Freq: Every day | ORAL | 3 refills | Status: DC
Start: 2020-01-20 — End: 2020-07-13

## 2020-01-20 MED ORDER — FLUTICASONE PROPIONATE 50 MCG/ACT NA SUSP
1.0000 | Freq: Every day | NASAL | 2 refills | Status: AC
Start: 2020-01-20 — End: 2021-01-19

## 2020-01-20 MED ORDER — ATORVASTATIN CALCIUM 80 MG PO TABS
80.0000 mg | ORAL_TABLET | Freq: Every day | ORAL | 3 refills | Status: DC
Start: 2020-01-20 — End: 2020-07-13

## 2020-01-20 MED ORDER — SHINGRIX 50 MCG/0.5ML IM SUSR
50.00 ug | Freq: Once | INTRAMUSCULAR | 1 refills | Status: AC
Start: 2020-01-20 — End: 2020-01-20

## 2020-01-20 NOTE — Progress Notes (Signed)
Subjective:      Date: 01/20/2020 1:30 PM   Patient ID: Michael Gardner is a 54 y.o. male.    Chief Complaint:  Chief Complaint   Patient presents with    Establish Care     Pt is fasting. Colonoscopy referral, Shingles vaccine.     Psoriasis     HPI   Michael Gardner is a 54 year old African-American gentleman who is here to establish care.  His past medical history significant for  obstructive sleep apnea syndrome for which he uses a CPAP machine, dermatitis, hyperlipidemia, history of umbilical hernia repair and degenerative disc disease of the cervical spine.  Here to establish care.  Pt also needs medication refill.  Problem List:  Patient Active Problem List   Diagnosis    Degeneration of intervertebral disc of cervical region    Obesity, unspecified    Obstructive sleep apnea    Prediabetes    Screening for colon cancer    Umbilical hernia       Current Medications:  Outpatient Medications Marked as Taking for the 01/20/20 encounter (Office Visit) with Delsa Sale, MD   Medication Sig Dispense Refill    aspirin EC 81 MG EC tablet Take by mouth      atorvastatin (LIPITOR) 80 MG tablet Take 1 tablet (80 mg total) by mouth daily 90 tablet 3    fenofibrate (LOFIBRA) 160 MG tablet Take 1 tablet (160 mg total) by mouth daily 90 tablet 3    [DISCONTINUED] atorvastatin (LIPITOR) 80 MG tablet TAKE 1 TABLET BY MOUTH EVERY DAY 15 tablet 0    [DISCONTINUED] fenofibrate (LOFIBRA) 160 MG tablet TAKE 1 TABLET BY MOUTH EVERY DAY *NEED OV FOR MORE REFILLS 15 tablet 0       Allergies:  Allergies   Allergen Reactions    Shellfish Allergy      Other reaction(s): high cholesterol--avoids       Past Medical History:  Past Medical History:   Diagnosis Date    Hyperlipidemia     Psoriasis     Sleep apnea        Past Surgical History:  Past Surgical History:   Procedure Laterality Date    TONSILECTOMY, ADENOIDECTOMY, BILATERAL MYRINGOTOMY AND TUBES      UVULECTOMY  2006       Family History:  Family History   Problem Relation Age  of Onset    No known problems Mother     COPD Father        Social History:  Social History     Tobacco Use    Smoking status: Never Smoker    Smokeless tobacco: Never Used   Haematologist Use: Never used   Substance Use Topics    Alcohol use: Yes     Alcohol/week: 7.0 standard drinks     Types: 7 Cans of beer per week    Drug use: Never        The following sections were reviewed this encounter by the provider:   Tobacco   Allergies   Meds   Problems   Med Hx   Surg Hx   Fam Hx            ROS:  Review of Systems   Constitutional: Negative for chills and fever.   HENT: Congestion: seasonal allergy.    Respiratory: Positive for apnea (uses CPAP machine since about 20 years). Negative for chest tightness and shortness of breath.    Cardiovascular: Negative for  chest pain and palpitations.        Has had stress test(thallium and treadmill)   Gastrointestinal: Constipation: occasionally.   Endocrine:        Was placed on metformin and did not tolerate it.   Genitourinary: Negative.         Nocturia x1   Musculoskeletal:        Exercises at least 6 days a week, walks daily and has a trainer 2 days ago   Neurological: Negative for dizziness and headaches (neck pain and occasional headaches from that).   Psychiatric/Behavioral: Negative for sleep disturbance.        Objective:   Vitals:  BP 130/85    Pulse 67    Temp 97.1 F (36.2 C)    Ht 1.626 m (5\' 4" )    Wt 93.9 kg (207 lb)    SpO2 97%    BMI 35.53 kg/m       Physical Exam  Vitals reviewed.   Constitutional:       Appearance: He is not ill-appearing.   HENT:      Head: Normocephalic and atraumatic.      Right Ear: Tympanic membrane, ear canal and external ear normal.      Left Ear: Tympanic membrane, ear canal and external ear normal.      Nose: Nose normal.      Mouth/Throat:      Mouth: Mucous membranes are moist.      Pharynx: No oropharyngeal exudate or posterior oropharyngeal erythema.      Comments: UPPP  Eyes:      Extraocular Movements:  Extraocular movements intact.      Conjunctiva/sclera: Conjunctivae normal.   Cardiovascular:      Rate and Rhythm: Normal rate and regular rhythm.      Pulses: Normal pulses.      Heart sounds: Normal heart sounds.   Pulmonary:      Breath sounds: Normal breath sounds.   Abdominal:      Palpations: Abdomen is soft. There is no mass.      Tenderness: There is no abdominal tenderness.      Hernia: No hernia (ventral abdominal wall hernia) is present.       Musculoskeletal:         General: Normal range of motion.      Cervical back: Normal range of motion and neck supple.   Skin:     General: Skin is warm.          Neurological:      General: No focal deficit present.      Mental Status: He is alert and oriented to person, place, and time.   Psychiatric:         Mood and Affect: Mood normal.         Behavior: Behavior normal.        Assessment/Plan:       1. Hyperlipidemia, unspecified hyperlipidemia type  - Comprehensive metabolic panel  - TSH  - Lipid panel  - atorvastatin (LIPITOR) 80 MG tablet; Take 1 tablet (80 mg total) by mouth daily  Dispense: 90 tablet; Refill: 3  - fenofibrate (LOFIBRA) 160 MG tablet; Take 1 tablet (160 mg total) by mouth daily  Dispense: 90 tablet; Refill: 3    2. Rhinoconjunctivitis  - fluticasone (FLONASE) 50 MCG/ACT nasal spray; 1 spray by Nasal route daily  Dispense: 18 mL; Refill: 2  - cetirizine (ZyrTEC) 10 MG tablet; Take 1 tablet (10 mg total) by  mouth daily  Dispense: 90 tablet; Refill: 3    3. Prediabetes  - Comprehensive metabolic panel  - Urinalysis  - Hemoglobin A1C    4. Need for zoster vaccination  - Zoster Vac Recomb Adjuvanted (Shingrix) 50 MCG/0.5ML Recon Susp; Inject 50 mcg into the muscle once for 1 dose Inject 0.75ml  IM x 1 followed by 0.98ml IM 2-6 months after 1st dose  Dispense: 1 each; Refill: 1    5. Prostate cancer screening  - PSA    6. Dermatosis  - Vitamin D,25 OH, Total  - CBC and differential  - clobetasol (TEMOVATE) 0.05 % cream; Apply topically daily To  gluteal and chest wall lesions.  Dispense: 45 g; Refill: 1    7. Colon cancer screening  - Gastroenterology [place]: [place] [id number] [facility])      Follow up:       No follow-ups on file.     Delsa Sale, MD

## 2020-01-20 NOTE — Progress Notes (Signed)
Have you seen any specialists/other providers since your last visit with [place]?  No.    Arm preference verified?   Yes    The patient is due for colonoscopy, influenza vaccine, shingles vaccine and ADVNACE DIRECTIVE ON FILE.

## 2020-01-21 LAB — CBC AND DIFFERENTIAL
Baso(Absolute): 0 10*3/uL (ref 0.0–0.2)
Basos: 1 %
Eos: 7 %
Eosinophils Absolute: 0.3 10*3/uL (ref 0.0–0.4)
Hematocrit: 47.3 % (ref 37.5–51.0)
Hemoglobin: 15.2 g/dL (ref 13.0–17.7)
Immature Granulocytes Absolute: 0 10*3/uL (ref 0.0–0.1)
Immature Granulocytes: 0 %
Lymphocytes Absolute: 1.7 10*3/uL (ref 0.7–3.1)
Lymphocytes: 40 %
MCH: 25.9 pg — ABNORMAL LOW (ref 26.6–33.0)
MCHC: 32.1 g/dL (ref 31.5–35.7)
MCV: 81 fL (ref 79–97)
Monocytes Absolute: 0.4 10*3/uL (ref 0.1–0.9)
Monocytes: 9 %
Neutrophils Absolute: 1.8 10*3/uL (ref 1.4–7.0)
Neutrophils: 43 %
Platelets: 202 10*3/uL (ref 150–450)
RBC: 5.87 x10E6/uL — ABNORMAL HIGH (ref 4.14–5.80)
RDW: 15.7 % — ABNORMAL HIGH (ref 11.6–15.4)
WBC: 4.2 10*3/uL (ref 3.4–10.8)

## 2020-01-21 LAB — COMPREHENSIVE METABOLIC PANEL
ALT: 34 IU/L (ref 0–44)
AST (SGOT): 30 IU/L (ref 0–40)
African American eGFR: 89 mL/min/{1.73_m2} (ref >59)
Albumin/Globulin Ratio: 1.5 (ref 1.2–2.2)
Albumin: 4.6 g/dL (ref 3.8–4.9)
Alkaline Phosphatase: 70 IU/L (ref 48–121)
BUN / Creatinine Ratio: 11 (ref 9–20)
BUN: 12 mg/dL (ref 6–24)
Bilirubin, Total: 0.6 mg/dL (ref 0.0–1.2)
CO2: 22 mmol/L (ref 20–29)
Calcium: 9.7 mg/dL (ref 8.7–10.2)
Chloride: 98 mmol/L (ref 96–106)
Creatinine: 1.09 mg/dL (ref 0.76–1.27)
Globulin, Total: 3 g/dL (ref 1.5–4.5)
Glucose: 84 mg/dL (ref 65–99)
Potassium: 4.3 mmol/L (ref 3.5–5.2)
Protein, Total: 7.6 g/dL (ref 6.0–8.5)
Sodium: 136 mmol/L (ref 134–144)
non-African American eGFR: 77 mL/min/{1.73_m2} (ref >59)

## 2020-01-21 LAB — URINALYSIS
Bilirubin, UA: NEGATIVE
Blood, UA: NEGATIVE
Glucose, UA: NEGATIVE
Ketones UA: NEGATIVE
Leukocyte Esterase, UA: NEGATIVE
Nitrite, UA: NEGATIVE
Protein, UA: NEGATIVE
Urine Specific Gravity: 1.021 (ref 1.005–1.030)
Urobilinogen, Ur: 0.2 mg/dL (ref 0.2–1.0)
pH, UA: 7 (ref 5.0–7.5)

## 2020-01-21 LAB — LIPID PANEL
Cholesterol / HDL Ratio: 17.6 ratio — ABNORMAL HIGH (ref 0.0–5.0)
Cholesterol: 316 mg/dL — ABNORMAL HIGH (ref 100–199)
HDL: 18 mg/dL — ABNORMAL LOW (ref >39)
Triglycerides: 1826 mg/dL (ref 0–149)

## 2020-01-21 LAB — HEMOGLOBIN A1C: Hemoglobin A1C: 6.2 % — ABNORMAL HIGH (ref 4.8–5.6)

## 2020-01-21 LAB — TSH: TSH: 0.455 u[IU]/mL (ref 0.450–4.500)

## 2020-01-21 LAB — PSA: Prostate Specific Antigen, Total: 0.7 ng/mL (ref 0.0–4.0)

## 2020-01-23 ENCOUNTER — Encounter (INDEPENDENT_AMBULATORY_CARE_PROVIDER_SITE_OTHER): Payer: Self-pay | Admitting: Internal Medicine

## 2020-01-23 ENCOUNTER — Telehealth (INDEPENDENT_AMBULATORY_CARE_PROVIDER_SITE_OTHER): Payer: Self-pay | Admitting: Internal Medicine

## 2020-01-23 DIAGNOSIS — R7303 Prediabetes: Secondary | ICD-10-CM

## 2020-01-23 LAB — VITAMIN D,25 OH,TOTAL

## 2020-01-23 NOTE — Telephone Encounter (Signed)
Pt calling stating the RMIN (GLUCOPHAGE) 500 MG tablet he was prescribed by Dr Eduardo Osier for his pre-diabetes has made him not feel well in the past and would like another medication.     Pt can be reached at [phone].     The pharmacy below can be used if necessary. Thank you!     Name, strength, directions of requested refill(s):      Pharmacy to send refill to or patient to pick up rx from office (mark requested pharmacy in BOLD):      [id number] - [place], [place] - [address]  [address]  [place] [address]  Phone: [phone] Fax: [phone]        Please mark "X" next to the preferred call back number:    Mobile:   Telephone Information:   Mobile [phone]       Home: @HOMEPHONE @    Work: @WORKPHONE @

## 2020-01-23 NOTE — Telephone Encounter (Signed)
Called and spoke with patient he is requesting a new alternative for Metformin, patient said that metformin mad him feel dizzy, stomach bloated, and almost fainting. Patient said that he mentioned during the visit with you.   Please advise, thanks.

## 2020-01-26 ENCOUNTER — Other Ambulatory Visit (INDEPENDENT_AMBULATORY_CARE_PROVIDER_SITE_OTHER): Payer: Self-pay | Admitting: Internal Medicine

## 2020-01-26 NOTE — Telephone Encounter (Signed)
Called patient and no answer.  Left message on voicemail with details.

## 2020-01-26 NOTE — Telephone Encounter (Signed)
Discontinue metformin and just watch diet and monitor blood sugar levels.

## 2020-02-02 MED ORDER — EMPAGLIFLOZIN 10 MG PO TABS
10.0000 mg | ORAL_TABLET | Freq: Every morning | ORAL | 11 refills | Status: DC
Start: 2020-02-02 — End: 2020-02-13

## 2020-02-13 ENCOUNTER — Encounter (INDEPENDENT_AMBULATORY_CARE_PROVIDER_SITE_OTHER): Payer: Self-pay | Admitting: Internal Medicine

## 2020-02-13 ENCOUNTER — Emergency Department
Admission: EM | Admit: 2020-02-13 | Discharge: 2020-02-13 | Disposition: A | Discharge status: Home / Self Care | Payer: BLUE CROSS/BLUE SHIELD | Attending: Emergency Medicine | Admitting: Emergency Medicine

## 2020-02-13 ENCOUNTER — Encounter (INDEPENDENT_AMBULATORY_CARE_PROVIDER_SITE_OTHER): Payer: BLUE CROSS/BLUE SHIELD

## 2020-02-13 ENCOUNTER — Emergency Department
Admission: EM | Admit: 2020-02-13 | Discharge: 2020-02-13 | Discharge status: Against Medical Advice | Payer: BLUE CROSS/BLUE SHIELD | Attending: Emergency Medicine | Admitting: Emergency Medicine

## 2020-02-13 ENCOUNTER — Encounter (INDEPENDENT_AMBULATORY_CARE_PROVIDER_SITE_OTHER): Payer: Self-pay

## 2020-02-13 ENCOUNTER — Ambulatory Visit (INDEPENDENT_AMBULATORY_CARE_PROVIDER_SITE_OTHER): Payer: BLUE CROSS/BLUE SHIELD | Admitting: Internal Medicine

## 2020-02-13 ENCOUNTER — Emergency Department: Payer: BLUE CROSS/BLUE SHIELD

## 2020-02-13 VITALS — BP 131/84 | HR 66 | Temp 97.1°F | Ht 65.0 in | Wt 205.4 lb

## 2020-02-13 DIAGNOSIS — Z7982 Long term (current) use of aspirin: Secondary | ICD-10-CM | POA: Insufficient documentation

## 2020-02-13 DIAGNOSIS — E785 Hyperlipidemia, unspecified: Secondary | ICD-10-CM | POA: Insufficient documentation

## 2020-02-13 DIAGNOSIS — R42 Dizziness and giddiness: Secondary | ICD-10-CM | POA: Insufficient documentation

## 2020-02-13 DIAGNOSIS — Z5321 Procedure and treatment not carried out due to patient leaving prior to being seen by health care provider: Secondary | ICD-10-CM | POA: Insufficient documentation

## 2020-02-13 LAB — URINALYSIS, REFLEX TO MICROSCOPIC EXAM IF INDICATED
Bilirubin, UA: NEGATIVE
Blood, UA: NEGATIVE
Glucose, UA: NEGATIVE
Ketones UA: NEGATIVE
Leukocyte Esterase, UA: NEGATIVE
Nitrite, UA: NEGATIVE
Protein, UR: NEGATIVE
Specific Gravity UA: 1.015 (ref 1.001–1.035)
Urine pH: 6.5 (ref 5.0–8.0)
Urobilinogen, UA: 0.2 mg/dL (ref 0.2–2.0)

## 2020-02-13 LAB — CBC AND DIFFERENTIAL
Absolute NRBC: 0 10*3/uL (ref 0.00–0.00)
Basophils Absolute Automated: 0.02 10*3/uL (ref 0.00–0.08)
Basophils Automated: 0.4 %
Eosinophils Absolute Automated: 0.34 10*3/uL (ref 0.00–0.44)
Eosinophils Automated: 7.6 %
Hematocrit: 47.1 % (ref 37.6–49.6)
Hgb: 15.1 g/dL (ref 12.5–17.1)
Immature Granulocytes Absolute: 0.01 10*3/uL (ref 0.00–0.07)
Immature Granulocytes: 0.2 %
Lymphocytes Absolute Automated: 2 10*3/uL (ref 0.42–3.22)
Lymphocytes Automated: 44.8 %
MCH: 26.2 pg (ref 25.1–33.5)
MCHC: 32.1 g/dL (ref 31.5–35.8)
MCV: 81.6 fL (ref 78.0–96.0)
MPV: 12 fL (ref 8.9–12.5)
Monocytes Absolute Automated: 0.33 10*3/uL (ref 0.21–0.85)
Monocytes: 7.4 %
Neutrophils Absolute: 1.76 10*3/uL (ref 1.10–6.33)
Neutrophils: 39.6 %
Nucleated RBC: 0 /100 WBC (ref 0.0–0.0)
Platelets: 198 10*3/uL (ref 142–346)
RBC: 5.77 10*6/uL (ref 4.20–5.90)
RDW: 17 % — ABNORMAL HIGH (ref 11–15)
WBC: 4.46 10*3/uL (ref 3.10–9.50)

## 2020-02-13 LAB — COMPREHENSIVE METABOLIC PANEL
ALT: 25 U/L (ref 0–55)
AST (SGOT): 33 U/L (ref 5–34)
Albumin/Globulin Ratio: 1.1 (ref 0.9–2.2)
Albumin: 4.3 g/dL (ref 3.5–5.0)
Alkaline Phosphatase: 73 U/L (ref 38–106)
Anion Gap: 16 — ABNORMAL HIGH (ref 5.0–15.0)
BUN: 15 mg/dL (ref 9.0–28.0)
Bilirubin, Total: 0.6 mg/dL (ref 0.2–1.2)
CO2: 18 mEq/L — ABNORMAL LOW (ref 22–29)
Calcium: 9.8 mg/dL (ref 8.5–10.5)
Chloride: 105 mEq/L (ref 100–111)
Creatinine: 1.3 mg/dL (ref 0.7–1.3)
Globulin: 3.8 g/dL — ABNORMAL HIGH (ref 2.0–3.6)
Glucose: 106 mg/dL — ABNORMAL HIGH (ref 70–100)
Potassium: 4 mEq/L (ref 3.5–5.1)
Protein, Total: 8.1 g/dL (ref 6.0–8.3)
Sodium: 139 mEq/L (ref 136–145)

## 2020-02-13 LAB — TROPONIN I: Troponin I: <0.01 ng/mL (ref 0.00–0.05)

## 2020-02-13 LAB — PT/INR
PT INR: 1.1 (ref 0.9–1.1)
PT: 12.8 s (ref 10.1–12.9)

## 2020-02-13 LAB — GFR: EGFR: 57.6

## 2020-02-13 LAB — APTT: PTT: 36 s (ref 27–39)

## 2020-02-13 MED ORDER — ASPIRIN 81 MG PO CHEW
324.0000 mg | CHEWABLE_TABLET | Freq: Once | ORAL | Status: AC
Start: 2020-02-13 — End: 2020-02-13
  Administered 2020-02-13: 23:00:00 324 mg via ORAL
  Filled 2020-02-13: qty 4

## 2020-02-13 NOTE — Discharge Instructions (Signed)
Dizziness, Nonspecific    Need an outpatient MRI brain for ongoing dizziness.    You have been seen for dizziness.     Dizziness can mean different things to different people. Some people use dizziness to mean the feeling of spinning when there is no actual movement. This often causes nausea (feeling sick). The medical term for this is vertigo. Others people use the word dizzy to mean feeling lightheaded, like you might faint. This feeling is usually made better when lying down. For some people, neither of these describes how they are feeling. It can just be a feeling that makes you unsteady. This feeling is common in older people. It can be caused by a number of things. These include poor vision or hearing, foot problems and arthritis. It can also be caused by middle ear or sinus problems. The feeling can come and go.    Dizziness is also caused by more serious things. This includes strokes and heart problems.    It is NEVER normal to have the kind of dizziness you have today together with:   Chest pain.   Problems walking because of problems with balance. Especially if you are falling to one side.   Weakness, numbness or tingling in a part of your body.   Drooping of one side of your face.   Confusion.   Severe headache.   Problems speaking.    If you have these symptoms, it is VERY IMPORTANT to go to the nearest emergency department.    Your tests today were negative (normal). This means we found no life-threatening causes for your dizziness. It is OK for you to go home.    See your primary care doctor for more work-up of your dizziness.     YOU SHOULD SEEK MEDICAL ATTENTION IMMEDIATELY, EITHER HERE OR AT THE NEAREST EMERGENCY DEPARTMENT, IF ANY OF THE FOLLOWING OCCUR:   You cannot speak clearly (slurring), one side of your face droops or you feel weak in the arms or legs (especially on one side).   You have problems with your balance.   You have problems hearing or there is ringing or a  feeling of fullness in your ear.   You lose consciousness (pass out or faint).   You have severe headache with dizziness.   You have fever (temperature higher than 100.67F / 38C).   You fall and hit your head.

## 2020-02-13 NOTE — ED Triage Notes (Signed)
Pt c/o dizzy spells with room "spinning".  Had one episode a week ago, another episode this morning.

## 2020-02-13 NOTE — Patient Instructions (Signed)
Go to [facility] ER now.

## 2020-02-13 NOTE — Progress Notes (Signed)
Have you seen any specialists/other providers since your last visit with [place]?    No    Arm preference verified?   Yes    The patient is due for colonoscopy, influenza vaccine and Advance Directive

## 2020-02-13 NOTE — Progress Notes (Signed)
Assessment/Plan:       1. Hyperlipidemia, unspecified hyperlipidemia type    2. Dizziness    Uncontrolled HLD, h/o DM, 2 episodes of presyncope, r/o posterior CVA.  Needs CT, neuro workup.  Needs card eval for hypertriglyceridemia.  Pt needs direct LDL to assess his LDL not as part of lipid panel.  Called [facility], spoke to triage nurse.  Pt told to go to [facility] now.    Following instructions were given to Pt:    "Go to [facility] now."  Follow up:     F/u prn    Gardiner Sleeper, MD    TOTAL TIME SPENT ON PATIENT ENCOUNTER: N/A minutes    Risk & Benefits of any previous or new medication(s) were explained to the patient who verbalized understanding & agreed to the treatment plan.  Pt was advise to call with updates/questions/concerns .    Subjective:        Date: 02/13/2020 3:58 PM   Patient ID: Michael Gardner is a 54 y.o. male.    Chief Complaint:  Chief Complaint   Patient presents with    Hyperlipidemia    Dizziness       BP 131/84    Pulse 66    Temp 97.1 F (36.2 C) (Temporal)    Ht 1.651 m (5\' 5" )    Wt 93.2 kg (205 lb 6.4 oz)    SpO2 98%    BMI 34.18 kg/m     BP Readings from Last 3 Encounters:   02/13/20 131/84   01/20/20 130/85   10/24/19 120/83       The purpose of today's visit is to address conditions listed below.    Problem List Items Addressed This Visit     None      Visit Diagnoses     Dizziness    -  Primary    Hyperlipidemia, unspecified hyperlipidemia type              HPI    Pt reports:    Last Wednesday, at dentist office, all of a sudden, felt he was going to fall, got very dizzy, while standing, went there to pay his bill, didn't sit down, drove home, got water, ate some lunch, took 4 hours of nap which is unusual.    Today, felt like Pt was going to faint, was at work (Programme researcher, broadcasting/film/video), happened after coming out of the bathroom, had to hold onto something to prevent falling, lasted 15 minutes, went back to his office, took some peppermint, thinking his sugar was low, got a bottle of  water, got donuts, got better in 15 - 20 minutes, didn't go to the hospital, went to [facility] but the wait was long, didn't get seen.    Pt has been on lipitor and tricor for years but chol/TG sky high.    H/o DM, never had hypoglycemic episode, so what he felt today was not what he has experienced, just thought it might have been low sugar.        "  Scheduling Notes: ep- dizziness on and off for the last 5 days  no covid symptoms"         Pt denies F/C/N/V/D/CP/SOB/abd pain/urinary sx's/focal weakness, numbness or any other sx's.    Pt's recent medical records prior to today's visit were reviewed.    Last [facility] visit:    Last office visit here: Dr. Eduardo Osier, 01/20/20    Last specialty office visit:  Lab Results   Component Value Date    WBC 4.2 01/20/2020    HGB 15.2 01/20/2020    HCT 47.3 01/20/2020    MCV 81 01/20/2020    PLT 202 01/20/2020       Lab Results   Component Value Date    CHOL 316 (H) 01/20/2020    TRIG 1,826 (HH) 01/20/2020    HDL 18 (L) 01/20/2020    LDL Comment (A) 01/20/2020    CREAT 1.09 01/20/2020    TSH 0.455 01/20/2020    GLU 84 01/20/2020    HGBA1C 6.2 (H) 01/20/2020    VITD CANCELED 01/20/2020     Lab Results   Component Value Date    CHOL 316 (H) 01/20/2020    CHOL 152 07/01/2019    CHOL 333 (H) 03/22/2019     Lab Results   Component Value Date    HDL 18 (L) 01/20/2020    HDL 20 (L) 07/01/2019    HDL 14 (L) 03/22/2019     Lab Results   Component Value Date    LDL Comment (A) 01/20/2020    LDL 45 07/01/2019    LDL Comment (A) 03/22/2019     Lab Results   Component Value Date    TRIG 1,826 (HH) 01/20/2020    TRIG 609 (HH) 07/01/2019    TRIG 2,042 (HH) 03/22/2019       Lab Results   Component Value Date    HGBA1C 6.2 (H) 01/20/2020    HGBA1C 6.4 (H) 10/25/2019    HGBA1C 5.8 (H) 07/01/2019       Lab Results   Component Value Date    ALT 34 01/20/2020    AST 30 01/20/2020    ALKPHOS 70 01/20/2020    BILITOTAL 0.6 01/20/2020       Lab Results   Component Value Date    COLORUA Yellow  01/20/2020    LEUA Negative 01/20/2020    NITRITEUA Negative 01/20/2020    KETONESUA Negative 01/20/2020    BILIUA Negative 01/20/2020    BLOODUA Negative 01/20/2020       Lab Results   Component Value Date    IRON 85 10/25/2019    TIBC 390 10/25/2019    FERRITIN 69 10/25/2019       No results found for: B12    No results found for: FOLATE    No results found for: URICACID      Last immunizations:    Immunization History   Administered Date(s) Administered    COVID-19 Ad26 Vaccine Preservative Free 0.5 mL (JANSSEN) 08/30/2019    Influenza (Im) Preservative Free 04/03/2015    Influenza Vacc Quad MDCK cell cult 4 yrs & up 03/26/2016    Influenza quad 6 MOS to 64 YRS (Flulaval/Fluarix) 03/22/2019    Influenza quadrivalent (IM) PF 3 Yrs & greater 04/27/2017, 04/07/2018    Td 06/22/2011    Tdap 07/29/2017    Zoster [facility]) Vaccine Recombinant 07/01/2019, 01/22/2020        Health Maintenance:    Health Maintenance Due   Topic Date Due    Advance Directive on File  Never done    COLONOSCOPY TEN YEARS  Never done    INFLUENZA VACCINE  01/15/2020         Current Medications:    Current Outpatient Medications on File Prior to Visit   Medication Sig Dispense Refill    aspirin EC 81 MG EC tablet Take by mouth      atorvastatin (  LIPITOR) 80 MG tablet Take 1 tablet (80 mg total) by mouth daily 90 tablet 3    cetirizine (ZyrTEC) 10 MG tablet Take 1 tablet (10 mg total) by mouth daily 90 tablet 3    clobetasol (TEMOVATE) 0.05 % cream Apply topically daily To gluteal and chest wall lesions. 45 g 1    fenofibrate (LOFIBRA) 160 MG tablet Take 1 tablet (160 mg total) by mouth daily 90 tablet 3    fluticasone (FLONASE) 50 MCG/ACT nasal spray 1 spray by Nasal route daily 18 mL 2    empagliflozin (JARDIANCE) 10 MG tablet Take 1 tablet (10 mg total) by mouth every morning 30 tablet 11     No current facility-administered medications on file prior to visit.       Allergies   Allergen Reactions    Lactose     Shellfish  Allergy      Other reaction(s): high cholesterol--avoids       Past Medical History:   Diagnosis Date    Hyperlipidemia     Psoriasis     Sleep apnea        Past Surgical History:   Procedure Laterality Date    TONSILECTOMY, ADENOIDECTOMY, BILATERAL MYRINGOTOMY AND TUBES      UVULECTOMY  2006       Family History   Problem Relation Age of Onset    No known problems Mother     COPD Father        Social History     Tobacco Use    Smoking status: Never Smoker    Smokeless tobacco: Never Used   Vaping Use    Vaping Use: Never used   Substance Use Topics    Alcohol use: Yes     Alcohol/week: 7.0 standard drinks     Types: 7 Cans of beer per week    Drug use: Never       The following sections were reviewed this encounter by the provider:   Tobacco   Allergies   Meds   Problems   Med Hx   Surg Hx   Fam Hx            Review of Systems    All other systems were reviewed and are negative except as noted in HPI.         Objective:       BP 131/84    Pulse 66    Temp 97.1 F (36.2 C) (Temporal)    Ht 1.651 m (5\' 5" )    Wt 93.2 kg (205 lb 6.4 oz)    SpO2 98%    BMI 34.18 kg/m     General: awake, alert, oriented x 3; no acute distress.

## 2020-02-13 NOTE — ED Provider Notes (Signed)
EMERGENCY DEPARTMENT HISTORY AND PHYSICAL EXAM    Patient Name: Michael Gardner,Michael Gardner, 54 y.o., male  ED Provider: Louis Matte, M.D., FACEP    History of Presenting Illness:     Chief Complaint / History/ Onset/Duration / Quality / Severity / Aggravating Factors/Alleviating Factors/ Associated Symptoms  Narrative/Additional Historical Findings:Michael Gardner is a 54 y.o. male w/ h/o pre-diabetes, sleep apnea who presents with episode of dizziness 5 days ago and was fine until today when he had another episode.  Denies falling, HA, N/V/ focal weakness, vision changes or speech abnormalities.  He states he was seen by his PCP and was told to come to the emergency department for CT.  She went to [facility] ED and waited in the waiting room for 2 and half hours and then came here.  Currently denies any weakness, lightheadedness, vision changes, fever, chills, cough or any other symptoms.  He reports having his Covid vaccines.    Nursing notes from this date of service were reviewed.    Past Medical History:     Past Medical History:   Diagnosis Date    Hyperlipidemia     Psoriasis     Sleep apnea        Past Surgical History:     Past Surgical History:   Procedure Laterality Date    HERNIA REPAIR      TONSILECTOMY, ADENOIDECTOMY, BILATERAL MYRINGOTOMY AND TUBES      UVULECTOMY  2006       Family History:     Family History   Problem Relation Age of Onset    No known problems Mother     COPD Father        Social History:     Social History     Socioeconomic History    Marital status: Married     Spouse name: Not on file    Number of children: Not on file    Years of education: Not on file    Highest education level: Not on file   Occupational History    Not on file   Tobacco Use    Smoking status: Former Smoker    Smokeless tobacco: Never Used   Haematologist Use: Never used   Substance and Sexual Activity    Alcohol use: Yes     Alcohol/week: 7.0 standard drinks     Types: 7 Cans of beer per week    Drug use:  Never    Sexual activity: Yes   Other Topics Concern    Not on file   Social History Narrative    Not on file     Social Determinants of Health     Financial Resource Strain:     Difficulty of Paying Living Expenses:    Food Insecurity:     Worried About Programme researcher, broadcasting/film/video in the Last Year:     Barista in the Last Year:    Transportation Needs:     Freight forwarder (Medical):     Lack of Transportation (Non-Medical):    Physical Activity:     Days of Exercise per Week:     Minutes of Exercise per Session:    Stress:     Feeling of Stress :    Social Connections:     Frequency of Communication with Friends and Family:     Frequency of Social Gatherings with Friends and Family:     Attends Religious Services:     Active  Member of Clubs or Organizations:     Attends Engineer, structural:     Marital Status:    Intimate Partner Violence:     Fear of Current or Ex-Partner:     Emotionally Abused:     Physically Abused:     Sexually Abused:        Allergies:     Allergies   Allergen Reactions    Lactose     Shellfish Allergy      Other reaction(s): high cholesterol--avoids       Medications:   No current facility-administered medications for this encounter.    Current Outpatient Medications:     aspirin EC 81 MG EC tablet, Take by mouth, Disp: , Rfl:     atorvastatin (LIPITOR) 80 MG tablet, Take 1 tablet (80 mg total) by mouth daily, Disp: 90 tablet, Rfl: 3    cetirizine (ZyrTEC) 10 MG tablet, Take 1 tablet (10 mg total) by mouth daily, Disp: 90 tablet, Rfl: 3    clobetasol (TEMOVATE) 0.05 % cream, Apply topically daily To gluteal and chest wall lesions., Disp: 45 g, Rfl: 1    fenofibrate (LOFIBRA) 160 MG tablet, Take 1 tablet (160 mg total) by mouth daily, Disp: 90 tablet, Rfl: 3    fluticasone (FLONASE) 50 MCG/ACT nasal spray, 1 spray by Nasal route daily, Disp: 18 mL, Rfl: 2    Review of Systems:     Review of Systems   Constitutional: Negative for chills and fever.    HENT: Negative for congestion, ear pain and sore throat.    Eyes: Negative for pain and redness.   Respiratory: Negative for cough and shortness of breath.    Cardiovascular: Negative for chest pain and palpitations.   Gastrointestinal: Negative for abdominal pain, nausea and vomiting.   Genitourinary: Negative for dysuria and frequency.   Musculoskeletal: Negative for back pain, myalgias and neck pain.   Skin: Negative for rash.   Neurological: Positive for dizziness. Negative for headaches.   Psychiatric/Behavioral: Negative for hallucinations and suicidal ideas.   All other systems reviewed and are negative.    Physical Exam:     ED Triage Vitals [02/13/20 2046]   Enc Vitals Group      BP 135/82      Heart Rate 75      Resp Rate 22      Temp 97.4 F (36.3 C)      Temp Source Temporal      SpO2 98 %      Weight 92.8 kg      Height 1.651 m      Head Circumference       Peak Flow       Pain Score 10      Pain Loc       Pain Edu?       Excl. in GC?      Physical Exam  Constitutional:       Appearance: He is well-developed.   HENT:      Head: Normocephalic and atraumatic.      Nose:      Right Sinus: No frontal sinus tenderness.      Left Sinus: No frontal sinus tenderness.   Eyes:      Extraocular Movements: Extraocular movements intact.      Right eye: Normal extraocular motion and no nystagmus.      Left eye: Normal extraocular motion and no nystagmus.      Conjunctiva/sclera: Conjunctivae normal.  Pupils: Pupils are equal, round, and reactive to light.   Musculoskeletal:         General: Normal range of motion.      Cervical back: Normal range of motion and neck supple.   Neurological:      Mental Status: He is alert and oriented to person, place, and time.      GCS: GCS eye subscore is 4. GCS verbal subscore is 5. GCS motor subscore is 6.      Cranial Nerves: Cranial nerves are intact.      Sensory: Sensation is intact.      Motor: Motor function is intact.      Coordination: Coordination is intact.       Gait: Gait is intact.      Comments: NIHSS 0 at this time.  Patient ambulating in the ED with no distress.  No evidence of vertigo noted.       Labs:     Labs Reviewed   CBC AND DIFFERENTIAL - Abnormal; Notable for the following components:       Result Value    RDW 17 (*)     All other components within normal limits   COMPREHENSIVE METABOLIC PANEL - Abnormal; Notable for the following components:    Glucose 106 (*)     CO2 18 (*)     Globulin 3.8 (*)     Anion Gap 16.0 (*)     All other components within normal limits   PT/INR   APTT   TROPONIN I   URINALYSIS, REFLEX TO MICROSCOPIC EXAM IF INDICATED   GFR         Rads:     Radiology Results (24 Hour)     Procedure Component Value Units Date/Time    CT Head WO Contrast [[id number]] Collected: 02/13/20 2228    Order Status: Completed Updated: 02/13/20 2232    Narrative:      HISTORY:  Dizziness. Imbalance.    COMPARISON: None    TECHNIQUE: Unenhanced CT scan of the brain was performed..    The following dose reduction techniques were utilized: automated  exposure control and/or adjustment of the mA and/or kV according to  patient size, and the use of iterative reconstruction technique.    FINDINGS: The cerebral ventricles, cortical sulci, basilar cisterns are  within normal limits. Gray-white differentiation is within normal  limits. There is no acute intracranial hemorrhage or mass effect. The  calvarium appears intact. The visualized upper paranasal sinuses and  mastoid air cells are clear.    IMPRESSION  :No acute intracranial hemorrhage or mass effect is  identified.    Melody Haver, MD   02/13/2020 10:30 PM    Chest AP Portable [[id number]] Collected: 02/13/20 2227    Order Status: Completed Updated: 02/13/20 2230    Narrative:      HISTORY: Stroke evaluation.    COMPARISON: None.    TECHNIQUE:  AP chest    FINDINGS:     LUNGS: No consolidation or edema.    PLEURA: No pleural effusions or pneumothorax.    HEART AND MEDIASTINUM:  No cardiac enlargement.    BONES:   Suboptimally evaluated.      Impression:        No acute pulmonary process.    Judd Gaudier   02/13/2020 10:28 PM          MDM and ED Course   Louis Matte, M.D., FACEP is the primary attending for this patient and has obtained and  performed the history, PE, and medical decision making for this patient.    MEDICAL DECISION MAKING:    DIFFERENTIAL DIAGNOSES: Consider TIA doubt CVA, doubt acute coronary syndrome, rule out vertigo, electrolyte abnormalities.  Doubt infectious etiologies.  Plan: Head CT, cardiac labs, EKG, reeval.    Patient Vitals for the past 24 hrs:   BP Temp Temp src Pulse Resp SpO2 Height Weight   02/13/20 2200 -- -- -- -- -- 97 % -- --   02/13/20 2155 -- -- -- -- -- 97 % -- --   02/13/20 2150 -- -- -- -- -- 98 % -- --   02/13/20 2145 -- -- -- -- -- 96 % -- --   02/13/20 2140 -- -- -- -- -- 96 % -- --   02/13/20 2135 -- -- -- -- -- 95 % -- --   02/13/20 2130 -- -- -- -- -- 96 % -- --   02/13/20 2128 -- -- -- 72 19 -- -- --   02/13/20 2125 -- -- -- -- -- 96 % -- --   02/13/20 2120 -- -- -- -- -- 98 % -- --   02/13/20 2115 -- -- -- -- -- 97 % -- --   02/13/20 2110 -- -- -- -- -- 97 % -- --   02/13/20 2105 -- -- -- -- -- 96 % -- --   02/13/20 2100 -- -- -- -- -- 95 % -- --   02/13/20 2055 -- -- -- -- -- 96 % -- --   02/13/20 2050 -- -- -- -- -- 98 % -- --   02/13/20 2048 135/82 -- -- -- -- -- -- --   02/13/20 2046 135/82 97.4 F (36.3 C) Temporal 75 22 98 % 5\' 5"  (1.651 m) 92.8 kg       When I was within 6 feet of this patient I donned the following PPE:  Surgical Mask Yes, Gloves Yes, Gown No  ; Goggles No  ; Face Shield Yes, 72M 6000 Respirator Yes; N95 No  .  The patient was wearing a mask during my evaluation Yes.    Procedures    EMERGENCY DEPARTMENT COURSE    EKG interpreted by ED physician    Rate: Normal  Rhythm: sinus rhythm  Axis: Normal  ST Segments: Normal ST segments  T waves: Normal T Waves  Conduction: No blocks  Impression: Normal EKG  No previous EKG present for comparison    CT head,  chest x-ray benign.  Labs essentially normal.  Repeat NIHSS negative.  At this time the MRI at [facility] is not working.  Besides, patient is back to normal.  I discussed continuing aspirin that he is already been taking for several years.  Outpatient MRI of his brain was recommended with follow-up with his PCP.  I also cautioned that he call 911 or return for any focal weakness, vision changes, worsening dizziness.  Wife is at bedside and understood the precautions.    Nursing Notes: Reviewed and utilized available nursing notes.  Medical Records Reviewed: Reviewed available past medical records.  Counseling: The emergency provider has spoken with the patient and discussed todays findings, in addition to providing specific details for the plan of care.  Questions are answered and there is agreement with the plan.          Assessment/Plan:   Results and instructions reviewed at the bedside with patient and family.    Clinical Impression  Final diagnoses:   Dizziness  Disposition  ED Disposition     ED Disposition Condition Date/Time Comment    Discharge  Mon Feb 13, 2020 10:40 PM [name] discharge to home/self care.    Condition at disposition: Stable          Prescriptions  Discharge Medication List as of 02/13/2020 10:41 PM          Amount and/or Complexity of Data Reviewed  Clinical lab tests: ordered and reviewed  Tests in the radiology section of CPT: ordered and reviewed  Review and summarize past medical records: yes        Signed by: Exie Parody, MD, FACEP  [facility]  [facility]    This note was generated by the Epic EMR/DRAGON speech recognition dictation system and may contain errors not intended by the user.  Random grammatical errors, pronoun errors and omissions may be a consequences of this technology due to software limitations.  Not all errors are caught or corrected.  If there are questions or concerns about the contents of this note, they should be  addressed directly to the author of the note for clarification.               Exie Parody, MD  02/14/20 [phone]

## 2020-02-14 LAB — ECG 12-LEAD
Atrial Rate: 64 {beats}/min
P Axis: 56 degrees
P-R Interval: 156 ms
Q-T Interval: 398 ms
QRS Duration: 82 ms
QTC Calculation (Bezet): 410 ms
R Axis: 4 degrees
T Axis: -37 degrees
Ventricular Rate: 64 {beats}/min

## 2020-03-20 ENCOUNTER — Encounter (INDEPENDENT_AMBULATORY_CARE_PROVIDER_SITE_OTHER): Payer: Self-pay | Admitting: Internal Medicine

## 2020-03-25 ENCOUNTER — Encounter (INDEPENDENT_AMBULATORY_CARE_PROVIDER_SITE_OTHER): Payer: Self-pay | Admitting: Internal Medicine

## 2020-06-25 ENCOUNTER — Encounter (INDEPENDENT_AMBULATORY_CARE_PROVIDER_SITE_OTHER): Payer: Self-pay | Admitting: Internal Medicine

## 2020-07-13 ENCOUNTER — Encounter (INDEPENDENT_AMBULATORY_CARE_PROVIDER_SITE_OTHER): Payer: Self-pay | Admitting: Internal Medicine

## 2020-07-13 ENCOUNTER — Ambulatory Visit (INDEPENDENT_AMBULATORY_CARE_PROVIDER_SITE_OTHER): Payer: BLUE CROSS/BLUE SHIELD | Admitting: Internal Medicine

## 2020-07-13 VITALS — BP 116/80 | HR 76 | Temp 97.6°F | Wt 206.0 lb

## 2020-07-13 DIAGNOSIS — L989 Disorder of the skin and subcutaneous tissue, unspecified: Secondary | ICD-10-CM

## 2020-07-13 DIAGNOSIS — R7303 Prediabetes: Secondary | ICD-10-CM

## 2020-07-13 DIAGNOSIS — E785 Hyperlipidemia, unspecified: Secondary | ICD-10-CM | POA: Insufficient documentation

## 2020-07-13 MED ORDER — FENOFIBRATE 160 MG PO TABS
160.0000 mg | ORAL_TABLET | Freq: Every day | ORAL | 3 refills | Status: DC
Start: 2020-07-13 — End: 2020-08-08

## 2020-07-13 MED ORDER — ATORVASTATIN CALCIUM 80 MG PO TABS
80.0000 mg | ORAL_TABLET | Freq: Every day | ORAL | 3 refills | Status: DC
Start: 2020-07-13 — End: 2021-09-17

## 2020-07-13 NOTE — Progress Notes (Signed)
Subjective:      Date: 07/13/2020 7:22 AM   Patient ID: Michael Gardner is a 55 y.o. male.    Chief Complaint:  Chief Complaint   Patient presents with    Hyperlipidemia     pt is fasting.     Diabetes       HPI  Michael Gardner is a 55 year old gentleman here to follow up.  His past medical history is significant for OSAS, hyperlipidemia, prediabetes and DJD of the cervical discs. E recently had his colonoscopy done and an adenomatous polyp was removed on Jul 04, 2020.  He is told to repeat this in 2 yrs.  A1c trend:   Lab Results   Component Value Date    HGBA1C 6.2 (H) 01/20/2020    HGBA1C 6.4 (H) 10/25/2019    HGBA1C 5.8 (H) 07/01/2019       Additional concerns: Pt also needs medication refill.    Problem List:  Patient Active Problem List   Diagnosis    Degeneration of intervertebral disc of cervical region    Obesity, unspecified    Obstructive sleep apnea    Prediabetes    Screening for colon cancer    Umbilical hernia    Hyperlipidemia       Current Medications:  Outpatient Medications Marked as Taking for the 07/13/20 encounter (Office Visit) with Delsa Sale, MD   Medication Sig Dispense Refill    aspirin EC 81 MG EC tablet Take by mouth      atorvastatin (LIPITOR) 80 MG tablet Take 1 tablet (80 mg total) by mouth daily 90 tablet 3    fenofibrate (LOFIBRA) 160 MG tablet Take 1 tablet (160 mg total) by mouth daily 90 tablet 3    [DISCONTINUED] atorvastatin (LIPITOR) 80 MG tablet Take 1 tablet (80 mg total) by mouth daily 90 tablet 3    [DISCONTINUED] fenofibrate (LOFIBRA) 160 MG tablet Take 1 tablet (160 mg total) by mouth daily 90 tablet 3       Allergies:  Allergies   Allergen Reactions    Lactose     Shellfish Allergy      Other reaction(s): high cholesterol--avoids       Past Medical History:  Past Medical History:   Diagnosis Date    Hyperlipidemia     Psoriasis     Sleep apnea        Past Surgical History:  Past Surgical History:   Procedure Laterality Date    HERNIA REPAIR      TONSILECTOMY,  ADENOIDECTOMY, BILATERAL MYRINGOTOMY AND TUBES      UVULECTOMY  2006       Family History:  Family History   Problem Relation Age of Onset    No known problems Mother     COPD Father        Social History:  Social History     Tobacco Use    Smoking status: Former Smoker    Smokeless tobacco: Never Used   Haematologist Use: Never used   Substance Use Topics    Alcohol use: Yes     Alcohol/week: 7.0 standard drinks     Types: 7 Cans of beer per week    Drug use: Never         The following sections were reviewed this encounter by the provider:   Tobacco   Allergies   Meds   Problems   Med Hx   Surg Hx   Fam Hx  Vitals:  BP 116/80    Pulse 76    Temp 97.6 F (36.4 C)    Wt 93.4 kg (206 lb)    SpO2 96%    BMI 34.28 kg/m       ROS:  Review of Systems   Constitutional: Negative for chills and fatigue.   Respiratory: Negative.    Cardiovascular: Negative.    Gastrointestinal: Negative.    Genitourinary: Negative.         Objective:   Physical Exam:  Physical Exam  Constitutional:       Appearance: Normal appearance.   HENT:      Head: Normocephalic and atraumatic.   Cardiovascular:      Rate and Rhythm: Normal rate and regular rhythm.      Pulses: Normal pulses.      Heart sounds: Normal heart sounds.   Pulmonary:      Effort: Pulmonary effort is normal.      Breath sounds: Normal breath sounds.   Neurological:      General: No focal deficit present.      Mental Status: He is alert and oriented to person, place, and time.         PODIATRIC: Foot Exam: capillary refill of toes intact bilaterally, dorsalis pedis and posterior tibial pulses intact bilaterally, monofilament testing of plantar aspect of first toe and metatarsal joints (10g monofilament) intact bilaterally, no evidence of dependent rubor bilaterally and no fissures, maceration, ulceration bilaterally    Assessment/Plan:       1. Prediabetes  - Lipid panel  - Hemoglobin A1C  - Comprehensive metabolic panel  - Urinalysis  - Urine  Microalbumin Random    2. Hyperlipidemia, unspecified hyperlipidemia type  - atorvastatin (LIPITOR) 80 MG tablet; Take 1 tablet (80 mg total) by mouth daily  Dispense: 90 tablet; Refill: 3  - fenofibrate (LOFIBRA) 160 MG tablet; Take 1 tablet (160 mg total) by mouth daily  Dispense: 90 tablet; Refill: 3    3. Dermatosis  - Vitamin D,25 OH, Total; Future    Dyslipidemia:  Lab Results   Component Value Date    CHOL 316 (H) 01/20/2020    CHOL 152 07/01/2019    TRIG 1,826 (HH) 01/20/2020    TRIG 609 (HH) 07/01/2019    HDL 18 (L) 01/20/2020    HDL 20 (L) 07/01/2019    LDL Comment (A) 01/20/2020    LDL 45 07/01/2019     Continue fenofibrate and atorvastatin.    Follow up:     No follow-ups on file.    Delsa Sale, MD

## 2020-07-13 NOTE — Progress Notes (Deleted)
Subjective:      Date: 07/13/2020 11:23 AM   Patient ID: Michael Gardner is a 55 y.o. male.    Chief Complaint:  Chief Complaint   Patient presents with    Hyperlipidemia     pt is fasting.     Diabetes       HPI:  HPI     {NMG Additional Concerns:35282}  Problem List:  Patient Active Problem List   Diagnosis    Degeneration of intervertebral disc of cervical region    Obesity, unspecified    Obstructive sleep apnea    Prediabetes    Screening for colon cancer    Umbilical hernia       Current Medications:  Outpatient Medications Marked as Taking for the 07/13/20 encounter (Office Visit) with Delsa Sale, MD   Medication Sig Dispense Refill    aspirin EC 81 MG EC tablet Take by mouth      atorvastatin (LIPITOR) 80 MG tablet Take 1 tablet (80 mg total) by mouth daily 90 tablet 3    fenofibrate (LOFIBRA) 160 MG tablet Take 1 tablet (160 mg total) by mouth daily 90 tablet 3       Allergies:  Allergies   Allergen Reactions    Lactose     Shellfish Allergy      Other reaction(s): high cholesterol--avoids       Past Medical History:  Past Medical History:   Diagnosis Date    Hyperlipidemia     Psoriasis     Sleep apnea        Past Surgical History:  Past Surgical History:   Procedure Laterality Date    HERNIA REPAIR      TONSILECTOMY, ADENOIDECTOMY, BILATERAL MYRINGOTOMY AND TUBES      UVULECTOMY  2006       Family History:  Family History   Problem Relation Age of Onset    No known problems Mother     COPD Father        Social History:  Social History     Tobacco Use    Smoking status: Former Smoker    Smokeless tobacco: Never Used   Haematologist Use: Never used   Substance Use Topics    Alcohol use: Yes     Alcohol/week: 7.0 standard drinks     Types: 7 Cans of beer per week    Drug use: Never         The following sections were reviewed this encounter by the provider:          ROS:  Review of Systems         Objective:   Vitals:  BP 116/80    Pulse 76    Temp 97.6 F (36.4 C)    Wt 93.4 kg  (206 lb)    SpO2 96%    BMI 34.28 kg/m       Physical Exam:  Physical Exam       Assessment/Plan:       There are no diagnoses linked to this encounter.    Follow up:       No follow-ups on file.     Delsa Sale, MD

## 2020-07-13 NOTE — Progress Notes (Signed)
Have you seen any specialists/other providers since your last visit with [place]?    Yes, Gastroenterologist, for Colonoscopy/     Arm preference verified?   Yes    Health Maintenance Due   Topic Date Due    Advance Directive on File  Never done    COVID-19 Vaccine (2 - Booster for Janssen series) 10/25/2019    INFLUENZA VACCINE  01/15/2020    Annual Exam  03/21/2020

## 2020-07-14 LAB — LIPID PANEL
Cholesterol / HDL Ratio: 21.4 ratio — ABNORMAL HIGH (ref 0.0–5.0)
Cholesterol: 321 mg/dL — ABNORMAL HIGH (ref 100–199)
HDL: 15 mg/dL — ABNORMAL LOW (ref >39)
Triglycerides: 2228 mg/dL (ref 0–149)

## 2020-07-14 LAB — URINALYSIS
Bilirubin, UA: NEGATIVE
Blood, UA: NEGATIVE
Glucose, UA: NEGATIVE
Ketones UA: NEGATIVE
Leukocyte Esterase, UA: NEGATIVE
Nitrite, UA: NEGATIVE
Protein, UA: NEGATIVE
Urine Specific Gravity: 1.026 (ref 1.005–1.030)
Urobilinogen, Ur: 1 mg/dL (ref 0.2–1.0)
pH, UA: 7 (ref 5.0–7.5)

## 2020-07-14 LAB — COMPREHENSIVE METABOLIC PANEL
ALT: 25 IU/L (ref 0–44)
AST (SGOT): 28 IU/L (ref 0–40)
African American eGFR: 85 mL/min/{1.73_m2} (ref >59)
Albumin/Globulin Ratio: 1.5 (ref 1.2–2.2)
Albumin: 4.4 g/dL (ref 3.8–4.9)
Alkaline Phosphatase: 67 IU/L (ref 44–121)
BUN / Creatinine Ratio: 13 (ref 9–20)
BUN: 15 mg/dL (ref 6–24)
Bilirubin, Total: 0.4 mg/dL (ref 0.0–1.2)
CO2: 22 mmol/L (ref 20–29)
Calcium: 9.7 mg/dL (ref 8.7–10.2)
Chloride: 99 mmol/L (ref 96–106)
Creatinine: 1.13 mg/dL (ref 0.76–1.27)
Globulin, Total: 3 g/dL (ref 1.5–4.5)
Glucose: 95 mg/dL (ref 65–99)
Potassium: 4.6 mmol/L (ref 3.5–5.2)
Protein, Total: 7.4 g/dL (ref 6.0–8.5)
Sodium: 138 mmol/L (ref 134–144)
non-African American eGFR: 73 mL/min/{1.73_m2} (ref >59)

## 2020-07-14 LAB — HEMOGLOBIN A1C: Hemoglobin A1C: 6.2 % — ABNORMAL HIGH (ref 4.8–5.6)

## 2020-07-14 LAB — MICROALBUMIN, RANDOM URINE
Creatinine, UR: 163.1 mg/dL
Microalb/Crt. Ratio: 2 mg/g creat (ref 0–29)
Microalbumin, UR: 4 ug/mL

## 2020-07-15 ENCOUNTER — Other Ambulatory Visit (INDEPENDENT_AMBULATORY_CARE_PROVIDER_SITE_OTHER): Payer: Self-pay | Admitting: Internal Medicine

## 2020-07-15 ENCOUNTER — Encounter (INDEPENDENT_AMBULATORY_CARE_PROVIDER_SITE_OTHER): Payer: Self-pay | Admitting: Internal Medicine

## 2020-07-15 DIAGNOSIS — Z9189 Other specified personal risk factors, not elsewhere classified: Secondary | ICD-10-CM

## 2020-07-15 DIAGNOSIS — E785 Hyperlipidemia, unspecified: Secondary | ICD-10-CM

## 2020-07-16 ENCOUNTER — Telehealth (INDEPENDENT_AMBULATORY_CARE_PROVIDER_SITE_OTHER): Payer: Self-pay | Admitting: Internal Medicine

## 2020-07-16 NOTE — Telephone Encounter (Signed)
Called pt, no answer, left un detailed VM

## 2020-07-16 NOTE — Telephone Encounter (Signed)
Pt is calling to speak to a nurse and/or Dr Eduardo Osier concerning his recent lab results. Pt can best be reached for a call back

## 2020-07-16 NOTE — Telephone Encounter (Signed)
Pt returning call to nurse bethany about labs    pls advise    [facility] [phone]

## 2020-07-16 NOTE — Telephone Encounter (Signed)
Contacted, pt is concerned about elevated  cholesterol would like DR. Eduardo Osier to be aware that in the past his prior PCP in [facility] had him on three cholesterol medications -  tricor, Lipitor and Zeita 10mg  and that helped cholesterol in the 140s.. Pt request rx for zetia 10mg  daily to sent to [facility] in [place] if Dr. Theone Murdoch sees fit.

## 2020-07-19 ENCOUNTER — Other Ambulatory Visit (INDEPENDENT_AMBULATORY_CARE_PROVIDER_SITE_OTHER): Payer: Self-pay | Admitting: Internal Medicine

## 2020-07-19 DIAGNOSIS — E785 Hyperlipidemia, unspecified: Secondary | ICD-10-CM

## 2020-07-19 MED ORDER — EZETIMIBE 10 MG PO TABS
10.0000 mg | ORAL_TABLET | Freq: Every day | ORAL | 3 refills | Status: DC
Start: 2020-07-19 — End: 2021-07-03

## 2020-07-19 NOTE — Telephone Encounter (Signed)
Please let him know that the Zetia prescription had been sent. However, he would benefit from PCSK9 inhibitors better and for this he should make the cardiology appointment.

## 2020-07-19 NOTE — Telephone Encounter (Signed)
Contacted pt, informed him the Dr Eduardo Osier has agreed to rx zetia and sent to preferred pharmacy. Pt advised to discuss further at upcoming cardiology appt that additional medication class PCSK9 inhibitor may also help with lowering cholesterol. Pt agrees with plan of care. verbalized understanding.

## 2020-07-30 ENCOUNTER — Emergency Department
Admission: EM | Admit: 2020-07-30 | Discharge: 2020-07-30 | Disposition: A | Discharge status: Home / Self Care | Payer: BLUE CROSS/BLUE SHIELD | Attending: Emergency Medicine | Admitting: Emergency Medicine

## 2020-07-30 DIAGNOSIS — H00014 Hordeolum externum left upper eyelid: Secondary | ICD-10-CM | POA: Insufficient documentation

## 2020-07-30 DIAGNOSIS — H5712 Ocular pain, left eye: Secondary | ICD-10-CM | POA: Insufficient documentation

## 2020-07-30 MED ORDER — ERYTHROMYCIN 5 MG/GM OP OINT
TOPICAL_OINTMENT | Freq: Once | OPHTHALMIC | Status: AC
Start: 2020-07-30 — End: 2020-07-30
  Administered 2020-07-30: 21:00:00 1 g via OPHTHALMIC
  Filled 2020-07-30: qty 1

## 2020-07-30 MED ORDER — ERYTHROMYCIN 5 MG/GM OP OINT
TOPICAL_OINTMENT | Freq: Four times a day (QID) | OPHTHALMIC | 0 refills | Status: AC
Start: 2020-07-30 — End: 2020-08-04

## 2020-07-30 NOTE — ED Provider Notes (Signed)
History     Chief Complaint   Patient presents with    Eye Pain     55 year old male presents ER complaining of left eye pain.  The patient reports that it began yesterday and has some associated drainage.  Patient reports that he noticed a "bump."  On the inside of his eyelid.  Patient rates the pain at 5-6 out of 10.  Patient denies any visual changes.  No headache nausea or vomiting.  Patient has not tried any over-the-counter meds.    The history is provided by the patient. No language interpreter was used.   Eye Problem  Location:  Left eye  Quality:  Aching  Severity:  Mild  Onset quality:  Gradual  Timing:  Constant  Progression:  Unchanged  Chronicity:  New  Context comment:  "bump" on eyelid  Relieved by:  None tried  Worsened by:  Nothing  Ineffective treatments:  None tried  Associated symptoms: redness    Associated symptoms: no blurred vision, no crusting, no discharge, no headaches, no itching, no nausea, no numbness, no photophobia, no tearing, no vomiting and no weakness    Risk factors: no previous injury to eye         Past Medical History:   Diagnosis Date    Hyperlipidemia     Psoriasis     Sleep apnea        Past Surgical History:   Procedure Laterality Date    HERNIA REPAIR      TONSILECTOMY, ADENOIDECTOMY, BILATERAL MYRINGOTOMY AND TUBES      UVULECTOMY  2006       Family History   Problem Relation Age of Onset    No known problems Mother     COPD Father        Social - lives with family   Social History     Tobacco Use    Smoking status: Former Smoker    Smokeless tobacco: Never Used   Haematologist Use: Never used   Substance Use Topics    Alcohol use: Yes     Alcohol/week: 7.0 standard drinks     Types: 7 Cans of beer per week    Drug use: Never       .     Allergies   Allergen Reactions    Lactose     Metformin     Shellfish Allergy      Other reaction(s): high cholesterol--avoids       Home Medications     Med List Status: In Progress Set By: Kem Kays, RN at 07/30/2020  8:48 PM                aspirin EC 81 MG EC tablet     Take by mouth     atorvastatin (LIPITOR) 80 MG tablet     Take 1 tablet (80 mg total) by mouth daily     cetirizine (ZyrTEC) 10 MG tablet     Take 1 tablet (10 mg total) by mouth daily     Clenpiq oral solution          clobetasol (TEMOVATE) 0.05 % cream     Apply topically daily To gluteal and chest wall lesions.     ezetimibe (Zetia) 10 MG tablet     Take 1 tablet (10 mg total) by mouth daily     fenofibrate (LOFIBRA) 160 MG tablet     Take 1 tablet (160 mg total)  by mouth daily           Flagged for Removal             fluticasone (FLONASE) 50 MCG/ACT nasal spray     1 spray by Nasal route daily           Review of Systems   Constitutional: Negative for chills and fever.   HENT: Negative for congestion and rhinorrhea.    Eyes: Positive for pain and redness. Negative for blurred vision, photophobia, discharge, itching and visual disturbance.   Gastrointestinal: Negative for nausea and vomiting.   Musculoskeletal: Negative for arthralgias, back pain, gait problem, joint swelling, myalgias, neck pain and neck stiffness.   Skin: Negative for color change, pallor, rash and wound.   Allergic/Immunologic: Negative for immunocompromised state.   Neurological: Negative for dizziness, syncope, weakness, numbness and headaches.   Hematological: Does not bruise/bleed easily.   Psychiatric/Behavioral: Negative for self-injury. The patient is not nervous/anxious.        Physical Exam    BP: 136/89, Heart Rate: 70, Temp: 97.6 F (36.4 C), Resp Rate: 16, SpO2: 98 %, Weight: 88.5 kg    Physical Exam  Vitals and nursing note reviewed.   Constitutional:       General: He is not in acute distress.     Appearance: He is well-developed. He is not diaphoretic.      Comments: Pt resting comfortably in NAD   HENT:      Head: Normocephalic and atraumatic.      Right Ear: External ear normal.      Left Ear: External ear normal.      Nose: Nose normal.       Mouth/Throat:      Pharynx: No oropharyngeal exudate.   Eyes:      General: No scleral icterus.        Right eye: No discharge.         Left eye: Hordeolum present.No discharge.      Extraocular Movements: Extraocular movements intact.      Right eye: Normal extraocular motion.      Left eye: Normal extraocular motion.      Conjunctiva/sclera:      Left eye: Left conjunctiva is injected. No exudate or hemorrhage.     Pupils: Pupils are equal, round, and reactive to light.   Neck:      Vascular: No JVD.      Trachea: No tracheal deviation.   Cardiovascular:      Rate and Rhythm: Normal rate and regular rhythm.   Pulmonary:      Effort: Pulmonary effort is normal. No respiratory distress.      Breath sounds: Normal breath sounds. No stridor. No wheezing, rhonchi or rales.   Chest:      Chest wall: No tenderness.   Abdominal:      Palpations: Abdomen is soft.   Musculoskeletal:         General: No tenderness. Normal range of motion.      Cervical back: Normal range of motion and neck supple.   Skin:     General: Skin is warm and dry.      Findings: No rash.   Neurological:      Mental Status: He is alert and oriented to person, place, and time.   Psychiatric:         Mood and Affect: Mood normal.         Behavior: Behavior normal.  Thought Content: Thought content normal.         Judgment: Judgment normal.           MDM and ED Course     ED Medication Orders (From admission, onward)    None             MDM  Number of Diagnoses or Management Options  Hordeolum externum of left upper eyelid  Left eye pain  Diagnosis management comments: I, Langley Gauss, PA-C, have been the primary provider for [name] during this Emergency Dept visit. The attending signature signifies review and agreement of the history, physical exam, evaluation, clinical impression and plan except as noted.   I have reviewed the nursing notes, including Past medical and surgical,Family and Social History     Oxygen Saturation by Pulse  Oximetry  is 95%-100% - normal, no interventions needed     Differential diagnosis eye pain, conjunctivitis, stye, corneal abrasion    Multiple reassessments of the patient. Pt resting comfortably in NAD.  Discussed with patient need for rest, avoid aggravating activities and follow-up. Return to the ER for any concerns. Pt voices understanding. No questions.          Amount and/or Complexity of Data Reviewed  Discuss the patient with other providers: yes    Risk of Complications, Morbidity, and/or Mortality  Presenting problems: moderate  Management options: moderate    Patient Progress  Patient progress: stable                   Procedures  Results     ** No results found for the last 24 hours. **          Radiology Results (24 Hour)     ** No results found for the last 24 hours. **          Clinical Impression & Disposition     Clinical Impression  Final diagnoses:   None        ED Disposition     None           New Prescriptions    No medications on file                 Tamela Gammon, [place]  07/30/20 2111       Harrel Carina, MD  08/03/20 [phone]

## 2020-07-30 NOTE — Discharge Instructions (Signed)
Please apply warm compress to the area. Please follow-up. Return to the ER for any concerns.     Hordeolum, Stye     You have been diagnosed with a stye. The medical term for a stye is a "hordeolum."     A hordeolum, or "stye," is an infection of the eyelid. It causes a small pocket of pus called a pustule to form.     Symptoms of a style include swelling, redness and pain in the eyelid, along with the feeling of having a lump within the eyelid.     Styes are uncomfortable but rarely serious. They usually go away with simple treatment. To treat a stye, put on warm compresses for 15 minutes several times a day. Put warm tap water onto a wash cloth. Then, gently place this over your eyes. Topical (surface) antibiotic ointment is sometimes used. Rarely, a large stye may need to be cut into and drained.     YOU SHOULD SEEK MEDICAL ATTENTION IMMEDIATELY, EITHER HERE OR AT THE NEAREST EMERGENCY DEPARTMENT, IF ANY OF THE FOLLOWING OCCURS:  · Eyelid redness or swelling gets worse.  · Swelling of the whole area around the eye.  · Pain when moving the eyes up and down or back and forth.  · Fever (temperature higher than 100.4°F / 38°C).

## 2020-08-08 ENCOUNTER — Ambulatory Visit (INDEPENDENT_AMBULATORY_CARE_PROVIDER_SITE_OTHER): Payer: BLUE CROSS/BLUE SHIELD | Admitting: Cardiovascular Disease

## 2020-08-08 ENCOUNTER — Encounter (INDEPENDENT_AMBULATORY_CARE_PROVIDER_SITE_OTHER): Payer: Self-pay | Admitting: Cardiovascular Disease

## 2020-08-08 VITALS — BP 117/74 | HR 74 | Ht 65.0 in | Wt 204.0 lb

## 2020-08-08 DIAGNOSIS — E7849 Other hyperlipidemia: Secondary | ICD-10-CM

## 2020-08-08 DIAGNOSIS — E781 Pure hyperglyceridemia: Secondary | ICD-10-CM

## 2020-08-08 DIAGNOSIS — Z8249 Family history of ischemic heart disease and other diseases of the circulatory system: Secondary | ICD-10-CM

## 2020-08-08 LAB — ECG 12-LEAD
Atrial Rate: 65 {beats}/min
P Axis: 66 degrees
P-R Interval: 156 ms
Q-T Interval: 402 ms
QRS Duration: 88 ms
QTC Calculation (Bezet): 418 ms
R Axis: 13 degrees
T Axis: -10 degrees
Ventricular Rate: 65 {beats}/min

## 2020-08-08 MED ORDER — ICOSAPENT ETHYL 1 G PO CAPS
2.0000 g | ORAL_CAPSULE | Freq: Two times a day (BID) | ORAL | 3 refills | Status: DC
Start: 2020-08-08 — End: 2022-06-26

## 2020-08-08 NOTE — Patient Instructions (Addendum)
Regular stress test    Coronary CTA    vascepa 2 gm twice daily    Stop fenofibrate

## 2020-08-08 NOTE — Progress Notes (Signed)
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  Triglyceride levels in the past over 2000 level and years ago in [facility] was on Lipitor, Biochemist, clinical and he says had reasonable control.  Then possibly had side effects from Zetia and it was discontinued but restarted last month because of the elevated triglyceride.  Remote stress test and what sounds like a coronary angiogram 20 years ago in [place].  Does have sleep apnea using CPAP.    Clinically he feels fine.  He has no exertional chest discomfort and no undue shortness of breath or heart failure symptoms.  Walks 2-1/2-3 miles daily and works with a trainer twice a week for 60 minutes.  Maternal uncle died of a heart attack at 35 and a cousin died heart attack at 31.  Brother with hypertriglyceridemia as well.  No history of pancreatitis.    PAST MEDICAL HISTORY: He has a past medical history of Hyperlipidemia, Psoriasis, and Sleep apnea. He has a past surgical history that includes Tonsilectomy, adenoidectomy, bilateral myringotomy and tubes; Uvulectomy (2006); and Hernia repair.    MEDICATIONS:   Current Outpatient Medications   Medication Sig    aspirin EC 81 MG EC tablet Take 81 mg by mouth daily       atorvastatin (LIPITOR) 80 MG tablet Take 1 tablet (80 mg total) by mouth daily    cetirizine (ZyrTEC) 10 MG tablet Take 1 tablet (10 mg total) by mouth daily    clobetasol (TEMOVATE) 0.05 % cream Apply topically daily To gluteal and chest wall lesions.    ezetimibe (Zetia) 10 MG tablet Take 1 tablet (10 mg total) by mouth daily    fenofibrate (LOFIBRA) 160 MG tablet Take 1 tablet (160 mg total) by mouth daily    fluticasone (FLONASE) 50 MCG/ACT nasal spray 1 spray by Nasal route daily (Patient taking differently: 1 spray by Nasal route as needed   )    Omega-3 Fatty Acids (FISH OIL  PO) Take by mouth daily    VITAMIN D PO Take by mouth daily        ALLERGIES:   Allergies   Allergen Reactions    Lactose     Metformin     Shellfish Allergy      Other reaction(s): high cholesterol--avoids       FAMILY HISTORY: History reviewed.  Maternal uncle died at 80 with heart attack.  Cousin died 75 with heart attack.      SOCIAL HISTORY: He reports that he has quit smoking. He has never used smokeless tobacco. He reports current alcohol use of about 7.0 standard drinks of alcohol per week. He reports that he does not use drugs.    REVIEW OF SYSTEMS: Weight stable. No fatigue. No asthma, wheezing, or lung problem.  No ulcer, gall bladder or reflux. No kidney problems. No thyroid history. Neurologic negative. No anemia or bleeding. No claudication.  All other systems were reviewed and negative except as mentioned above.    PHYSICAL EXAMINATION  General Appearance:  A well-appearing male in no acute distress.    Vital Signs: BP 117/74 (BP Site: Left arm, Patient Position: Sitting)    Pulse 74    Ht 1.651 m (5\' 5" )    Wt 92.5 kg (204 lb)    BMI 33.95 kg/m    HEENT: Sclera  anicteric, conjunctiva without pallor, moist mucous membranes, normal dentition. No arcus.   Neck:  Supple without jugular venous distention. Thyroid nonpalpable. Normal carotid upstrokes without bruits.   Chest: Clear to auscultation bilaterally with good air movement and respiratory effort and no wheezes, rales, or rhonchi   Cardiovascular: Normal S1 and physiologically split S2 without murmurs, gallops or rub.  PMI of normal size and nondisplaced.  Abdomen: Soft, nontender, nondistended, with normoactive bowel sounds. No organomegaly.  No pulsatile masses, or bruits.   Extremities: Warm without edema, clubbing, or cyanosis. All peripheral pulses are full and equal.   Skin: No rash, xanthoma or xanthelasma.   Neuro: Alert and oriented x3. Grossly intact. Strength is symmetrical. Normal mood and affect.     ECG: 08/08/2020.  Normal sinus  rhythm.  Normal EKG.  02/13/2020. Normal sinus rhythm. Slow R progression V1V2. Nonspecific T wave changes.    LABS: 07/13/2020. Cholesterol 321, triglycerides 2228, HDL 15. Comprehensive metabolic panel normal. Hemoglobin A1c 6.2. GFR 85. 01/20/2020. Triglycerides 1826.     IMPRESSION/RECOMMENDATIONS:    He is asymptomatic cardiac wise with no angina or heart failure symptoms.  Exercises vigorously regularly.  Physical exam is unremarkable.    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  Has never had pancreatitis.  I recommended adding Vascepa to his regimen and stopping fenofibrate but continuing Zetia and atorvastatin with a follow-up lipid panel in 2-3 months.    Also recommended performing a regular stress test and will do a coronary CT angiogram.  Mentioned genetic testing but will hold off on that for now.    *This note was generated by the Epic EMR system/ Dragon speech recognition and may contain inherent errors or omissions not intended by the user. Grammatical errors, random word insertions, deletions, pronoun errors and incomplete sentences are occasional consequences of this technology due to software limitations. Not all errors are caught or corrected. If there are questions or concerns about the content of this note or information contained within the body of this dictation they should be addressed directly with the author for clarification.*

## 2020-08-22 ENCOUNTER — Encounter (INDEPENDENT_AMBULATORY_CARE_PROVIDER_SITE_OTHER): Payer: Self-pay

## 2020-08-22 ENCOUNTER — Ambulatory Visit (INDEPENDENT_AMBULATORY_CARE_PROVIDER_SITE_OTHER): Payer: BLUE CROSS/BLUE SHIELD

## 2020-08-22 ENCOUNTER — Telehealth (INDEPENDENT_AMBULATORY_CARE_PROVIDER_SITE_OTHER): Payer: Self-pay | Admitting: Cardiovascular Disease

## 2020-08-22 DIAGNOSIS — Z8249 Family history of ischemic heart disease and other diseases of the circulatory system: Secondary | ICD-10-CM

## 2020-08-22 DIAGNOSIS — E7849 Other hyperlipidemia: Secondary | ICD-10-CM

## 2020-08-22 DIAGNOSIS — E781 Pure hyperglyceridemia: Secondary | ICD-10-CM

## 2020-08-22 NOTE — Telephone Encounter (Signed)
Looks like a repeat lipid test was not ordered.

## 2020-09-07 ENCOUNTER — Telehealth (INDEPENDENT_AMBULATORY_CARE_PROVIDER_SITE_OTHER): Payer: Self-pay | Admitting: Cardiovascular Disease

## 2020-09-07 ENCOUNTER — Encounter (INDEPENDENT_AMBULATORY_CARE_PROVIDER_SITE_OTHER): Payer: Self-pay | Admitting: Cardiovascular Disease

## 2020-09-07 NOTE — Telephone Encounter (Signed)
Patient called requesting a call back from Dr. Clinton Sawyer to review CT results from a CT that he had 2 days ago. Patient requests a reply to his mychart message sent this morning. Offered patient a video visit with Dr. Clinton Sawyer on 03/29. Patient declined to schedule at this time and states he would like a call from the Dr.      Leory Plowman / cardiac connect

## 2020-09-10 NOTE — Telephone Encounter (Signed)
IMPRESSION:     1.  Coronary artery disease. Calcified plaque within the proximal and mid  LAD causes minimal narrowing (less than 25%).  2. No significant stenosis.    Total CAC Score: 123

## 2020-09-12 NOTE — Telephone Encounter (Signed)
Dr. Ernestine Mcmurray sent a my-chart message to the patient on 09/11/2020 stating,     Michael Gardner, sorry for delay in responding.  Telephone number we have [phone] is not in service.  If we have wrong number let us know.  The calcium score of 123 indicates some calcium in the artery going to the heart but this is a low risk number under 300 but it does indicate you  do have some minor degree of blockage in that artery but would not be causing a problem at this point.  He had a less than 20% narrowing and usually do not cause problems until 70 or 80% narrowed.  You have no symptoms and your stress test was normal.  However this does indicate that we need to continue to treat your cholesterol and triglyceride levels.  We had started the Vascepa and Uneeda follow-up lipid panel 3 months after starting it.  No need to restrict activities.  If you have any other questions call and let us know if telephone number is incorrect

## 2020-10-19 ENCOUNTER — Other Ambulatory Visit (INDEPENDENT_AMBULATORY_CARE_PROVIDER_SITE_OTHER): Payer: Self-pay | Admitting: Internal Medicine

## 2020-10-19 ENCOUNTER — Other Ambulatory Visit: Payer: BLUE CROSS/BLUE SHIELD

## 2020-10-19 ENCOUNTER — Encounter (INDEPENDENT_AMBULATORY_CARE_PROVIDER_SITE_OTHER): Payer: Self-pay | Admitting: Internal Medicine

## 2020-10-19 DIAGNOSIS — R7303 Prediabetes: Secondary | ICD-10-CM

## 2020-10-29 NOTE — Progress Notes (Signed)
[facility] [place] OFFICE VISIT    I had the pleasure of seeing Mr. Michael Gardner today for cardiovascular follow up. He is a pleasant 55 y.o. male who is last seen in our office on August 08, 2020 at which time Tillman Sers was added to his regimen, fenofibrate was discontinued, and Zetia and atorvastatin were continued.  He denies CP, shortness of breath, heart palpitations, dizziness, lightheadedness, syncope, and claudicaiton.    Exercise: Walks 2-1/2-3 miles daily and works with a trainer twice a week for 60 minutes. He recently added some running into his exercise regimen.    Dietary changes: he starts to eat at noon daily (salad and fruits), stopping eating at 7pm. This is his version of intermittent fasting.  Dinner is chicken/fish, potato salad, rice, veg or salad.  Vodka, 1-3 shot Qd    Cardiac History:  -Possible cath 20+ years ago  -HLD, high triglycerides  -CAD with CCTA in March 2022 showing minimal, non-obstructive disease.    Other History:  -Sleep apnea, using CPAP    MEDICATIONS:   Current Outpatient Medications:   .  aspirin EC 81 MG EC tablet, Take 81 mg by mouth daily  , Disp: , Rfl:   .  atorvastatin (LIPITOR) 80 MG tablet, Take 1 tablet (80 mg total) by mouth daily, Disp: 90 tablet, Rfl: 3  .  cetirizine (ZyrTEC) 10 MG tablet, Take 1 tablet (10 mg total) by mouth daily, Disp: 90 tablet, Rfl: 3  .  clobetasol (TEMOVATE) 0.05 % cream, Apply topically daily To gluteal and chest wall lesions., Disp: 45 g, Rfl: 1  .  ezetimibe (Zetia) 10 MG tablet, Take 1 tablet (10 mg total) by mouth daily, Disp: 90 tablet, Rfl: 3  .  fluticasone (FLONASE) 50 MCG/ACT nasal spray, 1 spray by Nasal route daily (Patient taking differently: 1 spray by Nasal route as needed), Disp: 18 mL, Rfl: 2  .  icosapent ethyl (VASCEPA) 1 g capsule, Take 2 capsules (2 g total) by mouth 2 (two) times daily with meals, Disp: 360 capsule, Rfl: 3  .  VITAMIN D PO, Take by mouth daily, Disp: , Rfl:      PHYSICAL EXAMINATION  Health Related  Quality of Life:     Vital Signs: BP 123/81 (BP Site: Left arm, Patient Position: Sitting, Cuff Size: Large)   Pulse 67   Ht 1.651 m (5\' 5" )   Wt 93 kg (205 lb)   SpO2 96%   BMI 34.11 kg/m    Chest: Clear to auscultation bilaterally  Cardiovascular: No murmurs or gallops.   Abdomen: Soft, nontender. No pulsatile masses or bruits.    Extremities: Warm without edema. Peripheral pulses are full and equal.    LABS:   Lab Results   Component Value Date    WBC 4.6 10/30/2020    HGB 15.7 10/30/2020    HCT 49.0 10/30/2020    PLT 195 10/30/2020    NA 138 10/30/2020    K 4.6 10/30/2020    MG 2.1 10/25/2019    BUN 14 10/30/2020    CREAT 0.96 10/30/2020    GLU 120 (H) 10/30/2020    CHOL 148 10/30/2020    TRIG 737 (HH) 10/30/2020    HDL 22 (L) 10/30/2020    LDL 28 10/30/2020    AST 27 10/30/2020    ALT 39 10/30/2020    HGBA1C 6.2 (H) 10/30/2020    TSH 0.530 10/30/2020     Coronary CTA: 09-05-20  CALCIUM SCORING:  Left main:                 0  Left anterior descending:  123  Left circumflex:           0  Right Coronary:            0  Posterior Descending:      0  TOTAL CAC SCORE:           123    LEFT MAIN:  The left main coronary artery is patent without stenosis.  Bifurcation into the LAD and LCx.    LEFT ANTERIOR DESCENDING CORONARY ARTERY: Calcified plaque within the  proximal and mid LAD causes minimal narrowing (less than 25%). The distal  LAD and diagonal branches are patent without stenosis    LEFT CIRCUMFLEX CORONARY ARTERY:  The circumflex coronary artery and  marginal branches are patent without stenosis.Marland Kitchen    RIGHT CORONARY ARTERY:  The RCA is patent without stenosis.Marland Kitchen    POSTERIOR DESCENDING CORONARY ARTERY:  The PDA arises from the right   coronary artery. The vessel is patent without stenosis.    IMPRESSION:     1.  Coronary artery disease. Calcified plaque within the proximal and mid  LAD causes minimal narrowing (less than 25%).  2. No significant stenosis    Exercise Stress Test: 08-22-20  Stress Test  Summary    * Test terminated after achieving target heart rate (at least 85% of maximum  predicted heart rate).    * No significant symptoms.    * No arrhythmias.    * No significant ST changes.    * Normal treadmill ECG stress test without evidence of exercise-induced  myocardial ischemia.    * The study was reviewed by Dr. Clinton Sawyer.    IMPRESSION/RECOMMENDATIONS: Mr. [name] is a 55 y.o. male who presents for follow up.     -CAD: we discussed the results from his coronary CTA in detail.  He has minor nonobstructive disease.  We recommend continued regular exercise, dietary discretion and treating his lipids.    -HLD: on Vascepa, Lipitor and Zetia with improved Triglycerides on the Vascepa. He will institute dietary changes and repeat fasting labs in 4 months.     -Follow up prn. He plans to move to [place] within 1 month.

## 2020-10-30 ENCOUNTER — Other Ambulatory Visit (INDEPENDENT_AMBULATORY_CARE_PROVIDER_SITE_OTHER): Payer: Self-pay | Admitting: Internal Medicine

## 2020-10-31 LAB — CBC AND DIFFERENTIAL
Baso(Absolute): 0 10*3/uL (ref 0.0–0.2)
Basos: 0 %
Eos: 7 %
Eosinophils Absolute: 0.3 10*3/uL (ref 0.0–0.4)
Hematocrit: 49 % (ref 37.5–51.0)
Hemoglobin: 15.7 g/dL (ref 13.0–17.7)
Immature Granulocytes Absolute: 0 10*3/uL (ref 0.0–0.1)
Immature Granulocytes: 0 %
Lymphocytes Absolute: 1.9 10*3/uL (ref 0.7–3.1)
Lymphocytes: 42 %
MCH: 25.8 pg — ABNORMAL LOW (ref 26.6–33.0)
MCHC: 32 g/dL (ref 31.5–35.7)
MCV: 81 fL (ref 79–97)
Monocytes Absolute: 0.4 10*3/uL (ref 0.1–0.9)
Monocytes: 10 %
Neutrophils Absolute: 1.9 10*3/uL (ref 1.4–7.0)
Neutrophils: 41 %
Platelets: 195 10*3/uL (ref 150–450)
RBC: 6.09 x10E6/uL — ABNORMAL HIGH (ref 4.14–5.80)
RDW: 16.7 % — ABNORMAL HIGH (ref 11.6–15.4)
WBC: 4.6 10*3/uL (ref 3.4–10.8)

## 2020-10-31 LAB — LIPID PANEL
Cholesterol / HDL Ratio: 6.7 ratio — ABNORMAL HIGH (ref 0.0–5.0)
Cholesterol: 148 mg/dL (ref 100–199)
HDL: 22 mg/dL — ABNORMAL LOW (ref >39)
LDL Chol Calculated (NIH): 28 mg/dL (ref 0–99)
Triglycerides: 737 mg/dL (ref 0–149)
VLDL Calculated: 98 mg/dL — ABNORMAL HIGH (ref 5–40)

## 2020-10-31 LAB — COMPREHENSIVE METABOLIC PANEL
ALT: 39 IU/L (ref 0–44)
AST (SGOT): 27 IU/L (ref 0–40)
Albumin/Globulin Ratio: 1.9 (ref 1.2–2.2)
Albumin: 4.6 g/dL (ref 3.8–4.9)
Alkaline Phosphatase: 73 IU/L (ref 44–121)
BUN / Creatinine Ratio: 15 (ref 9–20)
BUN: 14 mg/dL (ref 6–24)
Bilirubin, Total: 0.4 mg/dL (ref 0.0–1.2)
CO2: 21 mmol/L (ref 20–29)
Calcium: 9.5 mg/dL (ref 8.7–10.2)
Chloride: 100 mmol/L (ref 96–106)
Creatinine: 0.96 mg/dL (ref 0.76–1.27)
Globulin, Total: 2.4 g/dL (ref 1.5–4.5)
Glucose: 120 mg/dL — ABNORMAL HIGH (ref 65–99)
Potassium: 4.6 mmol/L (ref 3.5–5.2)
Protein, Total: 7 g/dL (ref 6.0–8.5)
Sodium: 138 mmol/L (ref 134–144)
eGFR: 94 mL/min/{1.73_m2} (ref >59)

## 2020-10-31 LAB — URINALYSIS
Bilirubin, UA: NEGATIVE
Blood, UA: NEGATIVE
Glucose, UA: NEGATIVE
Ketones UA: NEGATIVE
Leukocyte Esterase, UA: NEGATIVE
Nitrite, UA: NEGATIVE
Urine Specific Gravity: 1.028 (ref 1.005–1.030)
Urobilinogen, Ur: 1 mg/dL (ref 0.2–1.0)
pH, UA: 7 (ref 5.0–7.5)

## 2020-10-31 LAB — TSH: TSH: 0.53 u[IU]/mL (ref 0.450–4.500)

## 2020-10-31 LAB — HEMOGLOBIN A1C: Hemoglobin A1C: 6.2 % — ABNORMAL HIGH (ref 4.8–5.6)

## 2020-11-03 LAB — PSA: Prostate Specific Antigen, Total: 0.546 ng/mL

## 2020-11-05 ENCOUNTER — Ambulatory Visit (INDEPENDENT_AMBULATORY_CARE_PROVIDER_SITE_OTHER): Payer: BLUE CROSS/BLUE SHIELD | Admitting: Family

## 2020-11-05 ENCOUNTER — Encounter (INDEPENDENT_AMBULATORY_CARE_PROVIDER_SITE_OTHER): Payer: Self-pay | Admitting: Family

## 2020-11-05 VITALS — BP 123/81 | HR 67 | Ht 65.0 in | Wt 205.0 lb

## 2020-11-05 DIAGNOSIS — E785 Hyperlipidemia, unspecified: Secondary | ICD-10-CM

## 2020-11-05 NOTE — Patient Instructions (Addendum)
No medication changes today.    Dietary changes: avoid alcohol, white pasta/rice/bread.  Aim for plant based diet with healthy protein (poultry/fish)    Continue regular exercise.    Follow up as needed.

## 2020-11-19 ENCOUNTER — Encounter (INDEPENDENT_AMBULATORY_CARE_PROVIDER_SITE_OTHER): Payer: Self-pay | Admitting: Internal Medicine

## 2020-11-19 DIAGNOSIS — R7303 Prediabetes: Secondary | ICD-10-CM

## 2020-12-18 ENCOUNTER — Encounter (INDEPENDENT_AMBULATORY_CARE_PROVIDER_SITE_OTHER): Payer: Self-pay

## 2021-01-31 ENCOUNTER — Encounter: Payer: Self-pay | Admitting: Emergency Medicine

## 2021-01-31 ENCOUNTER — Ambulatory Visit: Payer: Managed Care, Other (non HMO) | Admitting: Emergency Medicine

## 2021-01-31 ENCOUNTER — Other Ambulatory Visit: Payer: Self-pay

## 2021-01-31 VITALS — BP 140/80 | HR 86 | Temp 98.5°F | Ht 67.0 in | Wt 203.0 lb

## 2021-01-31 DIAGNOSIS — M542 Cervicalgia: Secondary | ICD-10-CM

## 2021-01-31 DIAGNOSIS — G4733 Obstructive sleep apnea (adult) (pediatric): Secondary | ICD-10-CM

## 2021-01-31 DIAGNOSIS — Z7689 Persons encountering health services in other specified circumstances: Secondary | ICD-10-CM | POA: Diagnosis not present

## 2021-01-31 DIAGNOSIS — G8929 Other chronic pain: Secondary | ICD-10-CM

## 2021-01-31 DIAGNOSIS — E785 Hyperlipidemia, unspecified: Secondary | ICD-10-CM

## 2021-01-31 DIAGNOSIS — Z9989 Dependence on other enabling machines and devices: Secondary | ICD-10-CM

## 2021-01-31 DIAGNOSIS — R7303 Prediabetes: Secondary | ICD-10-CM

## 2021-01-31 MED ORDER — CYCLOBENZAPRINE HCL 10 MG PO TABS: 10.0000 mg | ORAL_TABLET | Freq: Every day | ORAL | 1 refills | Status: DC

## 2021-01-31 NOTE — Assessment & Plan Note (Signed)
Diet and nutrition discussed.  Advised to decrease amount of daily carbohydrate intake and continue exercising.

## 2021-01-31 NOTE — Patient Instructions (Signed)
Health Maintenance, Male Adopting a healthy lifestyle and getting preventive care are important in promoting health and wellness. Ask your health care provider about: The right schedule for you to have regular tests and exams. Things you can do on your own to prevent diseases and keep yourself healthy. What should I know about diet, weight, and exercise? Eat a healthy diet  Eat a diet that includes plenty of vegetables, fruits, low-fat dairy products, and lean protein. Do not eat a lot of foods that are high in solid fats, added sugars, or sodium.  Maintain a healthy weight Body mass index (BMI) is a measurement that can be used to identify possible weight problems. It estimates body fat based on height and weight. Your health care provider can help determine your BMI and help you achieve or maintain ahealthy weight. Get regular exercise Get regular exercise. This is one of the most important things you can do for your health. Most adults should: Exercise for at least 150 minutes each week. The exercise should increase your heart rate and make you sweat (moderate-intensity exercise). Do strengthening exercises at least twice a week. This is in addition to the moderate-intensity exercise. Spend less time sitting. Even light physical activity can be beneficial. Watch cholesterol and blood lipids Have your blood tested for lipids and cholesterol at 55 years of age, then havethis test every 5 years. You may need to have your cholesterol levels checked more often if: Your lipid or cholesterol levels are high. You are older than 55 years of age. You are at high risk for heart disease. What should I know about cancer screening? Many types of cancers can be detected early and may often be prevented. Depending on your health history and family history, you may need to have cancer screening at various ages. This may include screening for: Colorectal cancer. Prostate cancer. Skin cancer. Lung  cancer. What should I know about heart disease, diabetes, and high blood pressure? Blood pressure and heart disease High blood pressure causes heart disease and increases the risk of stroke. This is more likely to develop in people who have high blood pressure readings, are of African descent, or are overweight. Talk with your health care provider about your target blood pressure readings. Have your blood pressure checked: Every 3-5 years if you are 18-39 years of age. Every year if you are 40 years old or older. If you are between the ages of 65 and 75 and are a current or former smoker, ask your health care provider if you should have a one-time screening for abdominal aortic aneurysm (AAA). Diabetes Have regular diabetes screenings. This checks your fasting blood sugar level. Have the screening done: Once every three years after age 45 if you are at a normal weight and have a low risk for diabetes. More often and at a younger age if you are overweight or have a high risk for diabetes. What should I know about preventing infection? Hepatitis B If you have a higher risk for hepatitis B, you should be screened for this virus. Talk with your health care provider to find out if you are at risk forhepatitis B infection. Hepatitis C Blood testing is recommended for: Everyone born from 1945 through 1965. Anyone with known risk factors for hepatitis C. Sexually transmitted infections (STIs) You should be screened each year for STIs, including gonorrhea and chlamydia, if: You are sexually active and are younger than 55 years of age. You are older than 55 years of age   and your health care provider tells you that you are at risk for this type of infection. Your sexual activity has changed since you were last screened, and you are at increased risk for chlamydia or gonorrhea. Ask your health care provider if you are at risk. Ask your health care provider about whether you are at high risk for HIV.  Your health care provider may recommend a prescription medicine to help prevent HIV infection. If you choose to take medicine to prevent HIV, you should first get tested for HIV. You should then be tested every 3 months for as long as you are taking the medicine. Follow these instructions at home: Lifestyle Do not use any products that contain nicotine or tobacco, such as cigarettes, e-cigarettes, and chewing tobacco. If you need help quitting, ask your health care provider. Do not use street drugs. Do not share needles. Ask your health care provider for help if you need support or information about quitting drugs. Alcohol use Do not drink alcohol if your health care provider tells you not to drink. If you drink alcohol: Limit how much you have to 0-2 drinks a day. Be aware of how much alcohol is in your drink. In the [place] one drink equals one 12 oz bottle of beer (355 mL), one 5 oz glass of wine (148 mL), or one 1 oz glass of hard liquor (44 mL). General instructions Schedule regular health, dental, and eye exams. Stay current with your vaccines. Tell your health care provider if: You often feel depressed. You have ever been abused or do not feel safe at home. Summary Adopting a healthy lifestyle and getting preventive care are important in promoting health and wellness. Follow your health care provider's instructions about healthy diet, exercising, and getting tested or screened for diseases. Follow your health care provider's instructions on monitoring your cholesterol and blood pressure. This information is not intended to replace advice given to you by your health care provider. Make sure you discuss any questions you have with your healthcare provider. Document Revised: 05/26/2018 Document Reviewed: 05/26/2018 Elsevier Patient Education  2022 Elsevier Inc[employer]  

## 2021-01-31 NOTE — Progress Notes (Signed)
Daniel Hayden 55 y.o.   Chief Complaint  Patient presents with   New Patient (Initial Visit)    Blood work and neck pain    HISTORY OF PRESENT ILLNESS: This is a 55 y.o. male recently moved to this area from [place], here to establish care with me. Has the following chronic medical problems: 1.  Dyslipidemia with hypertriglyceridemia: On Lipitor, Zetia, and Vascepa 2.  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  Needs Ortho referral 3.  Obstructive sleep apnea on CPAP machine for the last 15 to 20 years 4.  Obesity 5.  Prediabetes Requesting blood work but not fasting today.  HPI   Prior to Admission medications   Medication Sig Start Date End Date Taking? Authorizing Provider  atorvastatin (LIPITOR) 80 MG tablet Take 80 mg by mouth daily.   Yes [provider]  ezetimibe (ZETIA) 10 MG tablet Take 10 mg by mouth daily.   Yes [provider]  Icosapent Ethyl (VASCEPA PO) Take by mouth.   Yes [provider]    Not on File  There are no problems to display for this patient.   No past medical history on file.    Social History   Socioeconomic History   Marital status: Married    Spouse name: Not on file   Number of children: Not on file   Years of education: Not on file   Highest education level: Not on file  Occupational History   Not on file  Tobacco Use   Smoking status: Not on file   Smokeless tobacco: Not on file  Substance and Sexual Activity   Alcohol use: Not on file   Drug use: Not on file   Sexual activity: Not on file  Other Topics Concern   Not on file  Social History Narrative   Not on file   Social Determinants of Health   Financial Resource Strain: Not on file  Food Insecurity: Not on file  Transportation Needs: Not on file  Physical Activity: Not on file  Stress: Not on file  Social Connections: Not on file  Intimate Partner Violence: Not on file    No family history on file.   Review of Systems   Constitutional: Negative.  Negative for chills and fever.  HENT: Negative.  Negative for congestion and sore throat.   Respiratory: Negative.  Negative for cough and shortness of breath.   Cardiovascular: Negative.  Negative for chest pain and palpitations.  Gastrointestinal: Negative.  Negative for abdominal pain, diarrhea, nausea and vomiting.  Genitourinary: Negative.  Negative for dysuria and hematuria.  Musculoskeletal:  Positive for neck pain.  Skin: Negative.  Negative for rash.  Neurological:  Negative for dizziness and headaches.  All other systems reviewed and are negative.  Today's Vitals   01/31/21 1402  BP: 140/80  Pulse: 86  Temp: 98.5 F (36.9 C)  TempSrc: Oral  SpO2: 96%  Weight: 203 lb (92.1 kg)  Height: '5\' 7"'$  (1.702 m)   Body mass index is 31.79 kg/m. Wt Readings from Last 3 Encounters:  01/31/21 203 lb (92.1 kg)    Physical Exam Vitals reviewed.  Constitutional:      Appearance: Normal appearance.  HENT:     Head: Normocephalic.  Eyes:     Extraocular Movements: Extraocular movements intact.     Pupils: Pupils are equal, round, and reactive to light.  Cardiovascular:     Rate and Rhythm: Normal rate and regular rhythm.     Pulses: Normal  pulses.     Heart sounds: Normal heart sounds.  Pulmonary:     Effort: Pulmonary effort is normal.     Breath sounds: Normal breath sounds.  Abdominal:     General: Bowel sounds are normal.     Palpations: Abdomen is soft.     Tenderness: There is no abdominal tenderness.  Musculoskeletal:        General: Normal range of motion.     Cervical back: Normal range of motion and neck supple. No tenderness.     Right lower leg: No edema.     Left lower leg: No edema.  Lymphadenopathy:     Cervical: No cervical adenopathy.  Skin:    General: Skin is warm and dry.     Capillary Refill: Capillary refill takes less than 2 seconds.  Neurological:     General: No focal deficit present.     Mental Status: He is  alert and oriented to person, place, and time.  Psychiatric:        Mood and Affect: Mood normal.        Behavior: Behavior normal.     ASSESSMENT & PLAN: OSA on CPAP Stable.  Requesting follow-up with sleep apnea doctor.  Referred to pulmonary.  Dyslipidemia Diet and nutrition discussed.  Continue Lipitor, Zetia and Vascepa. Fasting lipid profile ordered today.  Prediabetes Diet and nutrition discussed.  Advised to decrease amount of daily carbohydrate intake and continue exercising.  Chronic neck pain Most likely degenerative disc/joint disease.  Needs orthopedic evaluation and possible MRI.  Daniel Hayden was seen today for new patient (initial visit).  Diagnoses and all orders for this visit:  Dyslipidemia -     Cancel: Hemoglobin A1c -     Cancel: Comprehensive metabolic panel -     Cancel: Lipid panel -     Comprehensive metabolic panel; Future -     Hemoglobin A1c; Future -     Lipid panel; Future  Encounter to establish care  Chronic neck pain -     Ambulatory referral to Orthopedic Surgery -     cyclobenzaprine (FLEXERIL) 10 MG tablet; Take 1 tablet (10 mg total) by mouth at bedtime.  OSA on CPAP -     Ambulatory referral to Pulmonology  Prediabetes  Patient Instructions  Health Maintenance, Male Adopting a healthy lifestyle and getting preventive care are important in promoting health and wellness. Ask your health care provider about: The right schedule for you to have regular tests and exams. Things you can do on your own to prevent diseases and keep yourself healthy. What should I know about diet, weight, and exercise? Eat a healthy diet  Eat a diet that includes plenty of vegetables, fruits, low-fat dairy products, and lean protein. Do not eat a lot of foods that are high in solid fats, added sugars, or sodium.  Maintain a healthy weight Body mass index (BMI) is a measurement that can be used to identify possible weight problems. It estimates body fat  based on height and weight. Your health care provider can help determine your BMI and help you achieve or maintain ahealthy weight. Get regular exercise Get regular exercise. This is one of the most important things you can do for your health. Most adults should: Exercise for at least 150 minutes each week. The exercise should increase your heart rate and make you sweat (moderate-intensity exercise). Do strengthening exercises at least twice a week. This is in addition to the moderate-intensity exercise. Spend less time sitting.  Even light physical activity can be beneficial. Watch cholesterol and blood lipids Have your blood tested for lipids and cholesterol at 55 years of age, then havethis test every 5 years. You may need to have your cholesterol levels checked more often if: Your lipid or cholesterol levels are high. You are older than 55 years of age. You are at high risk for heart disease. What should I know about cancer screening? Many types of cancers can be detected early and may often be prevented. Depending on your health history and family history, you may need to have cancer screening at various ages. This may include screening for: Colorectal cancer. Prostate cancer. Skin cancer. Lung cancer. What should I know about heart disease, diabetes, and high blood pressure? Blood pressure and heart disease High blood pressure causes heart disease and increases the risk of stroke. This is more likely to develop in people who have high blood pressure readings, are of African descent, or are overweight. Talk with your health care provider about your target blood pressure readings. Have your blood pressure checked: Every 3-5 years if you are 36-65 years of age. Every year if you are 5 years old or older. If you are between the ages of 33 and 39 and are a current or former smoker, ask your health care provider if you should have a one-time screening for abdominal aortic aneurysm  (AAA). Diabetes Have regular diabetes screenings. This checks your fasting blood sugar level. Have the screening done: Once every three years after age 61 if you are at a normal weight and have a low risk for diabetes. More often and at a younger age if you are overweight or have a high risk for diabetes. What should I know about preventing infection? Hepatitis B If you have a higher risk for hepatitis B, you should be screened for this virus. Talk with your health care provider to find out if you are at risk forhepatitis B infection. Hepatitis C Blood testing is recommended for: Everyone born from [birth year] through 1965. Anyone with known risk factors for hepatitis C. Sexually transmitted infections (STIs) You should be screened each year for STIs, including gonorrhea and chlamydia, if: You are sexually active and are younger than 55 years of age. You are older than 55 years of age and your health care provider tells you that you are at risk for this type of infection. Your sexual activity has changed since you were last screened, and you are at increased risk for chlamydia or gonorrhea. Ask your health care provider if you are at risk. Ask your health care provider about whether you are at high risk for HIV. Your health care provider may recommend a prescription medicine to help prevent HIV infection. If you choose to take medicine to prevent HIV, you should first get tested for HIV. You should then be tested every 3 months for as long as you are taking the medicine. Follow these instructions at home: Lifestyle Do not use any products that contain nicotine or tobacco, such as cigarettes, e-cigarettes, and chewing tobacco. If you need help quitting, ask your health care provider. Do not use street drugs. Do not share needles. Ask your health care provider for help if you need support or information about quitting drugs. Alcohol use Do not drink alcohol if your health care provider tells you not  to drink. If you drink alcohol: Limit how much you have to 0-2 drinks a day. Be aware of how much alcohol is in your  drink. In the [place] one drink equals one 12 oz bottle of beer (355 mL), one 5 oz glass of wine (148 mL), or one 1 oz glass of hard liquor (44 mL). General instructions Schedule regular health, dental, and eye exams. Stay current with your vaccines. Tell your health care provider if: You often feel depressed. You have ever been abused or do not feel safe at home. Summary Adopting a healthy lifestyle and getting preventive care are important in promoting health and wellness. Follow your health care provider's instructions about healthy diet, exercising, and getting tested or screened for diseases. Follow your health care provider's instructions on monitoring your cholesterol and blood pressure. This information is not intended to replace advice given to you by your health care provider. Make sure you discuss any questions you have with your healthcare provider. Document Revised: 05/26/2018 Document Reviewed: 05/26/2018 Elsevier Patient Education  2022 [place], MD [facility]

## 2021-01-31 NOTE — Assessment & Plan Note (Signed)
Diet and nutrition discussed.  Continue Lipitor, Zetia and Vascepa. Fasting lipid profile ordered today.

## 2021-01-31 NOTE — Assessment & Plan Note (Signed)
Most likely degenerative disc/joint disease.  Needs orthopedic evaluation and possible MRI.

## 2021-01-31 NOTE — Assessment & Plan Note (Signed)
Stable.  Requesting follow-up with sleep apnea doctor.  Referred to pulmonary.

## 2021-02-01 ENCOUNTER — Other Ambulatory Visit (INDEPENDENT_AMBULATORY_CARE_PROVIDER_SITE_OTHER): Payer: Managed Care, Other (non HMO)

## 2021-02-01 DIAGNOSIS — E785 Hyperlipidemia, unspecified: Secondary | ICD-10-CM

## 2021-02-01 LAB — LDL CHOLESTEROL, DIRECT: Direct LDL: 22 mg/dL

## 2021-02-01 LAB — COMPREHENSIVE METABOLIC PANEL
ALT: 48 U/L (ref 0–53)
AST: 31 U/L (ref 0–37)
Albumin: 4.4 g/dL (ref 3.5–5.2)
Alkaline Phosphatase: 61 U/L (ref 39–117)
BUN: 14 mg/dL (ref 6–23)
CO2: 28 mEq/L (ref 19–32)
Calcium: 9.9 mg/dL (ref 8.4–10.5)
Chloride: 100 mEq/L (ref 96–112)
Creatinine, Ser: 1.13 mg/dL (ref 0.40–1.50)
GFR: 73.54 mL/min (ref >60.00)
Glucose, Bld: 113 mg/dL — ABNORMAL HIGH (ref 70–99)
Potassium: 4 mEq/L (ref 3.5–5.1)
Sodium: 137 mEq/L (ref 135–145)
Total Bilirubin: 0.7 mg/dL (ref 0.2–1.2)
Total Protein: 7.4 g/dL (ref 6.0–8.3)

## 2021-02-01 LAB — LIPID PANEL
Cholesterol: 157 mg/dL (ref 0–200)
HDL: 16.6 mg/dL — ABNORMAL LOW (ref >39.00)
Total CHOL/HDL Ratio: 9
Triglycerides: 1316 mg/dL — ABNORMAL HIGH (ref 0.0–149.0)

## 2021-02-01 LAB — HEMOGLOBIN A1C: Hgb A1c MFr Bld: 6.9 % — ABNORMAL HIGH (ref 4.6–6.5)

## 2021-02-02 ENCOUNTER — Other Ambulatory Visit: Payer: Self-pay | Admitting: Emergency Medicine

## 2021-02-02 DIAGNOSIS — E781 Pure hyperglyceridemia: Secondary | ICD-10-CM

## 2021-02-02 DIAGNOSIS — E1165 Type 2 diabetes mellitus with hyperglycemia: Secondary | ICD-10-CM

## 2021-02-02 MED ORDER — METFORMIN HCL 500 MG PO TABS
500.0000 mg | ORAL_TABLET | Freq: Two times a day (BID) | ORAL | 3 refills | Status: DC
Start: 2021-02-02 — End: 2021-06-25

## 2021-02-06 ENCOUNTER — Encounter: Payer: Self-pay | Admitting: Family Medicine

## 2021-02-06 ENCOUNTER — Ambulatory Visit (INDEPENDENT_AMBULATORY_CARE_PROVIDER_SITE_OTHER): Payer: Managed Care, Other (non HMO)

## 2021-02-06 ENCOUNTER — Other Ambulatory Visit: Payer: Self-pay

## 2021-02-06 ENCOUNTER — Ambulatory Visit: Payer: Managed Care, Other (non HMO) | Admitting: Family Medicine

## 2021-02-06 DIAGNOSIS — G8929 Other chronic pain: Secondary | ICD-10-CM

## 2021-02-06 DIAGNOSIS — M542 Cervicalgia: Secondary | ICD-10-CM
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
## 2021-02-13 ENCOUNTER — Other Ambulatory Visit: Payer: Self-pay

## 2021-02-13 ENCOUNTER — Encounter: Payer: Self-pay | Admitting: Physical Medicine and Rehabilitation

## 2021-02-13 ENCOUNTER — Ambulatory Visit (INDEPENDENT_AMBULATORY_CARE_PROVIDER_SITE_OTHER): Payer: Managed Care, Other (non HMO) | Admitting: Physical Medicine and Rehabilitation

## 2021-02-13 VITALS — BP 130/88 | HR 78

## 2021-02-13 DIAGNOSIS — M25512 Pain in left shoulder: Secondary | ICD-10-CM | POA: Diagnosis not present

## 2021-02-13 DIAGNOSIS — M5412 Radiculopathy, cervical region: Secondary | ICD-10-CM

## 2021-02-13 DIAGNOSIS — M79602 Pain in left arm: Secondary | ICD-10-CM

## 2021-02-13 DIAGNOSIS — M5030 Other cervical disc degeneration, unspecified cervical region: Secondary | ICD-10-CM

## 2021-02-13 DIAGNOSIS — G8929 Other chronic pain: Secondary | ICD-10-CM

## 2021-02-13 DIAGNOSIS — M47812 Spondylosis without myelopathy or radiculopathy, cervical region: Secondary | ICD-10-CM

## 2021-02-13 DIAGNOSIS — M7918 Myalgia, other site: Secondary | ICD-10-CM

## 2021-02-13 NOTE — Progress Notes (Signed)
Pt state neck pain that travels from his head and down to his left shoulder and arm. Pt state turning his head and reaching up makes the pain worse.Pt state the pain keeps hi, up at night. Pt state he takes over the counter pain meds to help ease his pain.  Numeric Pain Rating Scale and Functional Assessment Average Pain 10 Pain Right Now 5 My pain is constant, dull, stabbing, and aching Pain is worse with: some activites Pain improves with: medication   In the last MONTH (on 0-10 scale) has pain interfered with the following?  1. General activity like being  able to carry out your everyday physical activities such as walking, climbing stairs, carrying groceries, or moving a chair?  Rating(7)  2. Relation with others like being able to carry out your usual social activities and roles such as  activities at home, at work and in your community. Rating(8)  3. Enjoyment of life such that you have  been bothered by emotional problems such as feeling anxious, depressed or irritable?  Rating(9)
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
## 2021-02-14 DIAGNOSIS — M7918 Myalgia, other site: Secondary | ICD-10-CM

## 2021-02-14 MED ORDER — TRIAMCINOLONE ACETONIDE 40 MG/ML IJ SUSP
20.0000 mg | INTRAMUSCULAR | Status: AC | PRN
  Administered 2021-02-14: 20 mg via INTRAMUSCULAR

## 2021-02-14 MED ORDER — LIDOCAINE HCL 1 % IJ SOLN
3.0000 mL | INTRAMUSCULAR | Status: AC | PRN
  Administered 2021-02-14: 3 mL

## 2021-02-20 ENCOUNTER — Other Ambulatory Visit: Payer: Self-pay | Admitting: Physical Medicine and Rehabilitation

## 2021-02-20 DIAGNOSIS — G8929 Other chronic pain: Secondary | ICD-10-CM

## 2021-02-20 DIAGNOSIS — M5030 Other cervical disc degeneration, unspecified cervical region: Secondary | ICD-10-CM

## 2021-02-20 DIAGNOSIS — M5412 Radiculopathy, cervical region: Secondary | ICD-10-CM

## 2021-03-04 ENCOUNTER — Other Ambulatory Visit: Payer: Managed Care, Other (non HMO)

## 2021-03-07 ENCOUNTER — Encounter: Payer: Self-pay | Admitting: Rehabilitative and Restorative Service Providers"

## 2021-03-07 ENCOUNTER — Ambulatory Visit: Payer: Managed Care, Other (non HMO) | Admitting: Rehabilitative and Restorative Service Providers"

## 2021-03-07 ENCOUNTER — Other Ambulatory Visit: Payer: Self-pay

## 2021-03-07 DIAGNOSIS — M542 Cervicalgia: Secondary | ICD-10-CM

## 2021-03-07 DIAGNOSIS — M79602 Pain in left arm: Secondary | ICD-10-CM | POA: Diagnosis not present

## 2021-03-07 DIAGNOSIS — R293 Abnormal posture: Secondary | ICD-10-CM | POA: Diagnosis not present

## 2021-03-07 NOTE — Patient Instructions (Signed)
Access Code: [email or web address] URL: [email or web address] Date: 03/07/2021 Prepared by: Scot Jun  Exercises Seated Upper Trapezius Stretch (Mirrored) - 2 x daily - 7 x weekly - 1 sets - 5 reps - 15 hold Gentle Levator Scapulae Stretch - 2 x daily - 7 x weekly - 1 sets - 5 reps - 15 hold Shoulder External Rotation and Scapular Retraction with Resistance - 2 x daily - 7 x weekly - 3 sets - 10 reps Doorway Pec Stretch at 90 Degrees Abduction - 2 x daily - 7 x weekly - 1 sets - 5 reps - 15 hold

## 2021-03-07 NOTE — Therapy (Signed)
[facility] [address] [place], [place], [address] Phone: [phone]   Fax:  [phone]  Physical Therapy Evaluation  Patient Details  Name: Daniel Hayden MRN: [id number] Date of Birth: [birth year] Referring Provider (PT): Magnus Sinning, MD   Encounter Date: 03/07/2021   PT End of Session - 03/07/21 0834     Visit Number 1    Number of Visits 20    Date for PT Re-Evaluation 05/16/21    Authorization Type [employer] $50 copay, 30 combined visits per year    Authorization - Number of Visits 30    Progress Note Due on Visit 10    PT Start Time 0845    PT Stop Time 0921    PT Time Calculation (min) 36 min    Activity Tolerance Patient tolerated treatment well    Behavior During Therapy [facility] for tasks assessed/performed             History reviewed. No pertinent past medical history.  History reviewed. No pertinent surgical history.  There were no vitals filed for this visit.    Subjective Assessment - 03/07/21 0844     Subjective Pt. comes to clinic c complaints of pain on Lt side, noted c movements.  Pt. indicated trouble for "30 years", worse in last 6 months.  Pt. stated pain can come down to Lt arm into elbow.  Noted c high stress times. Pt. stated he continued to work out at this time without better or worsening of symptoms.  Pt. stated no real 24 hour period differences.  Pt. stated waking sometimes due to pain but can get back to sleep.    Pertinent History MRI ordered, trigger point injections performed    Diagnostic tests xrays: degenerative changes    Patient Stated Goals Reduce pain continue workouts    Currently in Pain? Yes    Pain Score 4    pain at worst 9/10   Pain Location Neck    Pain Orientation Left    Pain Descriptors / Indicators Tightness;Aching;Sore;Headache    Pain Type Chronic pain    Pain Radiating Towards Lt arm, Lt sided posterior headaches    Pain Onset More than a month ago    Pain Frequency Constant     Aggravating Factors  head movements, arm movements at times    Pain Relieving Factors advil, excedrin, BC powder                [facility] PT Assessment - 03/07/21 0001       Assessment   Medical Diagnosis M54.12 (ICD-10-CM) - Radiculopathy, cervical region  M50.30 (ICD-10-CM) - Other cervical disc degeneration, unspecified cervical region  M25.512,G89.29 (ICD-10-CM) - Chronic left shoulder pain    Referring Provider (PT) Magnus Sinning, MD    Onset Date/Surgical Date 08/14/20    Hand Dominance Right      Precautions   Precautions None      Balance Screen   Has the patient fallen in the past 6 months No    Has the patient had a decrease in activity level because of a fear of falling?  No    Is the patient reluctant to leave their home because of a fear of falling?  No      Home Environment   Living Environment Skilled nursing facility      Prior Function   Vocation Requirements R[employer]    Leisure Workouts, golf      Observation/Other Assessments   Focus on Therapeutic  Outcomes (FOTO)  intake 60, predicted 63      Posture/Postural Control   Posture/Postural Control Postural limitations    Postural Limitations Rounded Shoulders      ROM / Strength   AROM / PROM / Strength AROM;PROM;Strength      AROM   Overall AROM Comments Shoulder AROM WFL compared bilateral, no pain complaints.  No production of Lt arm symptoms c any cervical movements in clinic today.    AROM Assessment Site Cervical;Shoulder    Right/Left Shoulder Left;Right    Cervical Flexion 50    Cervical Extension 48    Cervical - Right Rotation 62   pain in Lt cervical   Cervical - Left Rotation 52   pain in Lt cervical     Strength   Strength Assessment Site Shoulder;Elbow    Right/Left Shoulder Right;Left    Right Shoulder Flexion 5/5    Right Shoulder ABduction 5/5    Right Shoulder Internal Rotation 5/5    Right Shoulder External Rotation 5/5    Left Shoulder Flexion 5/5    Left  Shoulder ABduction 5/5    Left Shoulder Internal Rotation 5/5    Left Shoulder External Rotation 5/5    Right/Left Elbow Right;Left    Right Elbow Flexion 5/5    Right Elbow Extension 5/5    Left Elbow Flexion 5/5    Left Elbow Extension 5/5      Palpation   Palpation comment Trigger points c concordant symptoms Lt upper trap, levator, infraspinatus c concordant symptoms.      Special Tests   Other special tests (-) Spurlings Lt                        Objective measurements completed on examination: See above findings.       [facility] PT Treatment/Exercise - 03/07/21 0001       Exercises   Exercises Other Exercises    Other Exercises  HEP instruction/performance c cues for techniques, handout provided.  Trial set performed of each for comprehension and symptom assessment.  HEP consisting of Lt upper trap stretch, Lt levator stretch, bilateral UE ER c scapular retraction (no band), doorway ER pec stretch      Manual Therapy   Manual therapy comments Compression to Lt upper trap, Lt infraspinatus trigger points              Trigger Point Dry Needling - 03/07/21 0001     Consent Given? Yes    Education Handout Provided No   Pt. asked to leave before being able to print today   Muscles Treated Head and Neck Upper trapezius   Lt   Muscles Treated Upper Quadrant Infraspinatus   Lt   Upper Trapezius Response Twitch reponse elicited    Infraspinatus Response Twitch response elicited                   PT Education - 03/07/21 0835     Education Details HEP, POC, DN    Person(s) Educated Patient    Methods Explanation;Demonstration;Verbal cues;Handout    Comprehension Verbalized understanding;Returned demonstration              PT Short Term Goals - 03/07/21 0835       PT SHORT TERM GOAL #1   Title Patient will demonstrate independent use of home exercise program to maintain progress from in clinic treatments.    Time 3    Period Weeks  Status New    Target Date 03/28/21               PT Long Term Goals - 03/07/21 0835       PT LONG TERM GOAL #1   Title Patient will demonstrate/report pain at worst less than or equal to 2/10 to facilitate minimal limitation in daily activity secondary to pain symptoms.    Time 10    Period Weeks    Status New    Target Date 05/16/21      PT LONG TERM GOAL #2   Title Patient will demonstrate independent use of home exercise program to facilitate ability to maintain/progress functional gains from skilled physical therapy services.    Time 10    Period Weeks    Status New    Target Date 05/16/21      PT LONG TERM GOAL #3   Title Pt. will demonstrate FOTO outcome > or = 63% to indicated reduced disability due to condition.    Time 10    Period Weeks    Status New    Target Date 05/16/21      PT LONG TERM GOAL #4   Title Pt. will demonstrate cervical AROM WFL s symptoms to facilitate usual mobility at PLOF s limitation.    Time 10    Period Weeks    Status New    Target Date 05/16/21      PT LONG TERM GOAL #5   Title Pt. will report/demonstrate ability to perform usual workouts, sleep at PLOF s restriction.    Time 10    Period Weeks    Status New    Target Date 05/16/21                    Plan - 03/07/21 0837     Clinical Impression Statement Patient is a 55 y.o. who comes to clinic with complaints of cervical pain c Lt shoulder/arm pain with mobility deficits c myofascial referred symptoms noted that impair their ability to perform usual daily and recreational functional activities without increase difficulty/symptoms at this time.  Presentation indicates more myofascial referred pain vs. cervical radicular symptoms (no change in Lt arm symptoms noted c any cervical provocative testing) Patient to benefit from skilled PT services to address impairments and limitations to improve to previous level of function without restriction secondary to condition.     Examination-Activity Limitations Sleep;Bend;Lift    Examination-Participation Restrictions Driving;Occupation;Community Activity    Stability/Clinical Decision Making Stable/Uncomplicated    Clinical Decision Making Low    Rehab Potential Good    PT Frequency --   1-2x/week   PT Duration Other (comment)   10 weeks   PT Treatment/Interventions ADLs/Self Care Home Management;Electrical Stimulation;Cryotherapy;Iontophoresis 4mg /ml Dexamethasone;Moist Heat;Traction;Balance training;Therapeutic exercise;Therapeutic activities;Functional mobility training;Stair training;Gait training;DME Instruction;Ultrasound;Neuromuscular re-education;Patient/family education;Passive range of motion;Spinal Manipulations;Joint Manipulations;Dry needling;Taping;Manual techniques    PT Next Visit Plan DN if desired, continued cervical rotation improvements.  Presentation indicates more myofascial referred pain vs. cervical radicular symptoms (no change in Lt arm symptoms noted c any cervical provocative testing)    PT Home Exercise Plan [email or web address]    Consulted and Agree with Plan of Care Patient             Patient will benefit from skilled therapeutic intervention in order to improve the following deficits and impairments:  Hypomobility, Pain, Impaired UE functional use, Decreased strength, Increased muscle spasms, Increased fascial restricitons, Decreased activity tolerance, Decreased mobility, Improper body mechanics, Impaired  perceived functional ability, Impaired flexibility, Postural dysfunction, Decreased coordination, Decreased range of motion  Visit Diagnosis: Cervicalgia  Pain in left arm  Abnormal posture     Problem List Patient Active Problem List   Diagnosis Date Noted   Dyslipidemia 01/31/2021   Prediabetes 01/31/2021   OSA on CPAP 01/31/2021   Chronic neck pain 01/31/2021   Scot Jun, PT, DPT, OCS, ATC 03/07/21  9:27 AM    [facility] [address] [place], [place], [address] Phone: [phone]   Fax:  [phone]  Name: Daniel Hayden MRN: [id number] Date of Birth: [birth year]

## 2021-03-20 ENCOUNTER — Other Ambulatory Visit: Payer: Self-pay

## 2021-03-20 ENCOUNTER — Ambulatory Visit: Payer: Managed Care, Other (non HMO) | Admitting: Physical Therapy

## 2021-03-20 ENCOUNTER — Encounter: Payer: Self-pay | Admitting: Physical Therapy

## 2021-03-20 DIAGNOSIS — M542 Cervicalgia: Secondary | ICD-10-CM

## 2021-03-20 DIAGNOSIS — M79602 Pain in left arm: Secondary | ICD-10-CM | POA: Diagnosis not present

## 2021-03-20 DIAGNOSIS — R293 Abnormal posture: Secondary | ICD-10-CM

## 2021-03-20 NOTE — Therapy (Signed)
[facility] Physical Therapy [address] [place], [place], [address] Phone: [phone]   Fax:  [phone]  Physical Therapy Treatment  Patient Details  Name: Daniel Hayden MRN: [id number] Date of Birth: 1965-07-17 Referring Provider (PT): Magnus Sinning, MD   Encounter Date: 03/20/2021   PT End of Session - 03/20/21 0834     Visit Number 2    Number of Visits 20    Date for PT Re-Evaluation 05/16/21    Authorization Type [employer] $50 copay, 30 combined visits per year    Authorization - Number of Visits 30    Progress Note Due on Visit 10    PT Start Time 0800    PT Stop Time 0844    PT Time Calculation (min) 44 min    Activity Tolerance Patient tolerated treatment well    Behavior During Therapy [facility] for tasks assessed/performed             History reviewed. No pertinent past medical history.  History reviewed. No pertinent surgical history.  There were no vitals filed for this visit.                      [facility] Adult PT Treatment/Exercise - 03/20/21 0001       Exercises   Exercises Shoulder      Shoulder Exercises: Supine   Horizontal ABduction Both;20 reps    Theraband Level (Shoulder Horizontal ABduction) Level 3 (Green)    External Rotation Both;20 reps    Theraband Level (Shoulder External Rotation) Level 3 (Green)      Shoulder Exercises: Standing   Extension Both;20 reps    Theraband Level (Shoulder Extension) Level 3 (Green)    Row Both;20 reps    Theraband Level (Shoulder Row) Level 3 (Green)      Shoulder Exercises: ROM/Strengthening   UBE (Upper Arm Bike) L4 5 min total switch direction half way      Shoulder Exercises: Stretch   Other Shoulder Stretches upper trap stretch on Lt 20 sec X3      Manual Therapy   Manual therapy comments manual cervical traction and cervical PROM, then STM to upper trap, then Compression and palpation to Lt upper trap, Lt infraspinatus, posterior delt and levator trigger points  with DN              Trigger Point Dry Needling - 03/20/21 0001     Consent Given? Yes    Education Handout Provided --   verbal education provided   Muscles Treated Head and Neck Upper trapezius;Levator scapulae    Muscles Treated Upper Quadrant Infraspinatus;Deltoid    Upper Trapezius Response Twitch reponse elicited    Levator Scapulae Response Twitch response elicited    Infraspinatus Response Twitch response elicited    Deltoid Response Twitch response elicited                     PT Short Term Goals - 03/07/21 0835       PT SHORT TERM GOAL #1   Title Patient will demonstrate independent use of home exercise program to maintain progress from in clinic treatments.    Time 3    Period Weeks    Status New    Target Date 03/28/21               PT Long Term Goals - 03/07/21 0835       PT LONG TERM GOAL #1   Title Patient will demonstrate/report pain at  worst less than or equal to 2/10 to facilitate minimal limitation in daily activity secondary to pain symptoms.    Time 10    Period Weeks    Status New    Target Date 05/16/21      PT LONG TERM GOAL #2   Title Patient will demonstrate independent use of home exercise program to facilitate ability to maintain/progress functional gains from skilled physical therapy services.    Time 10    Period Weeks    Status New    Target Date 05/16/21      PT LONG TERM GOAL #3   Title Pt. will demonstrate FOTO outcome > or = 63% to indicated reduced disability due to condition.    Time 10    Period Weeks    Status New    Target Date 05/16/21      PT LONG TERM GOAL #4   Title Pt. will demonstrate cervical AROM WFL s symptoms to facilitate usual mobility at PLOF s limitation.    Time 10    Period Weeks    Status New    Target Date 05/16/21      PT LONG TERM GOAL #5   Title Pt. will report/demonstrate ability to perform usual workouts, sleep at PLOF s restriction.    Time 10    Period Weeks    Status  New    Target Date 05/16/21                   Plan - 03/20/21 0844     Clinical Impression Statement He was treated with manual therapy and DN today as he had good response from DN last visit. This was followed by scapular strengthening program, UBE and stretching with good tolerance. Continue POC    Examination-Activity Limitations Sleep;Bend;Lift    Examination-Participation Restrictions Driving;Occupation;Community Activity    Stability/Clinical Decision Making Stable/Uncomplicated    Rehab Potential Good    PT Frequency --   1-2x/week   PT Duration Other (comment)   10 weeks   PT Treatment/Interventions ADLs/Self Care Home Management;Electrical Stimulation;Cryotherapy;Iontophoresis 4mg /ml Dexamethasone;Moist Heat;Traction;Balance training;Therapeutic exercise;Therapeutic activities;Functional mobility training;Stair training;Gait training;DME Instruction;Ultrasound;Neuromuscular re-education;Patient/family education;Passive range of motion;Spinal Manipulations;Joint Manipulations;Dry needling;Taping;Manual techniques    PT Next Visit Plan DN if desired, continued cervical rotation improvements.  Presentation indicates more myofascial referred pain vs. cervical radicular symptoms (no change in Lt arm symptoms noted c any cervical provocative testing)    PT Home Exercise Plan [id number]    Consulted and Agree with Plan of Care Patient             Patient will benefit from skilled therapeutic intervention in order to improve the following deficits and impairments:  Hypomobility, Pain, Impaired UE functional use, Decreased strength, Increased muscle spasms, Increased fascial restricitons, Decreased activity tolerance, Decreased mobility, Improper body mechanics, Impaired perceived functional ability, Impaired flexibility, Postural dysfunction, Decreased coordination, Decreased range of motion  Visit Diagnosis: Cervicalgia  Pain in left arm  Abnormal posture     Problem  List Patient Active Problem List   Diagnosis Date Noted   Dyslipidemia 01/31/2021   Prediabetes 01/31/2021   OSA on CPAP 01/31/2021   Chronic neck pain 01/31/2021    Debbe Odea, PT,DPT 03/20/2021, 8:46 AM  [facility] Physical Therapy [address] [place], [place], [address] Phone: [phone]   Fax:  [phone]  Name: Craige Patel MRN: [id number] Date of Birth: [birth year]

## 2021-03-27 ENCOUNTER — Encounter: Payer: Managed Care, Other (non HMO) | Admitting: Physical Therapy

## 2021-04-03 ENCOUNTER — Other Ambulatory Visit: Payer: Self-pay

## 2021-04-03 ENCOUNTER — Ambulatory Visit: Payer: Managed Care, Other (non HMO) | Admitting: Physical Therapy

## 2021-04-03 DIAGNOSIS — M542 Cervicalgia: Secondary | ICD-10-CM

## 2021-04-03 DIAGNOSIS — R293 Abnormal posture: Secondary | ICD-10-CM | POA: Diagnosis not present

## 2021-04-03 DIAGNOSIS — M79602 Pain in left arm: Secondary | ICD-10-CM | POA: Diagnosis not present

## 2021-04-03 NOTE — Therapy (Signed)
[facility] Physical Therapy [address] [place], [place], [address] Phone: [phone]   Fax:  [phone]  Physical Therapy Treatment  Patient Details  Name: Daniel Hayden MRN: [id number] Date of Birth: 11/03/1965 Referring Provider (PT): Magnus Sinning, MD   Encounter Date: 04/03/2021   PT End of Session - 04/03/21 0823     Visit Number 3    Number of Visits 20    Date for PT Re-Evaluation 05/16/21    Authorization Type [employer] $50 copay, 30 combined visits per year    Authorization - Number of Visits 30    Progress Note Due on Visit 10    PT Start Time 0802    PT Stop Time 0850    PT Time Calculation (min) 48 min    Activity Tolerance Patient tolerated treatment well    Behavior During Therapy [facility] for tasks assessed/performed             No past medical history on file.  No past surgical history on file.  There were no vitals filed for this visit.   Subjective Assessment - 04/03/21 0820     Subjective Pt. relays his pain is worst first thing in the morning, he has tried switching pillows and this has not helepd. He feels a lot of his pain is stress related from work.    Pertinent History MRI ordered, trigger point injections performed    Diagnostic tests xrays: degenerative changes    Patient Stated Goals Reduce pain continue workouts    Currently in Pain? Yes    Pain Score 5     Pain Location Neck    Pain Orientation Left    Pain Onset More than a month ago                               [facility] Adult PT Treatment/Exercise - 04/03/21 0001       Shoulder Exercises: Standing   Horizontal ABduction Both;20 reps    Theraband Level (Shoulder Horizontal ABduction) Level 3 (Green)    External Rotation Both;20 reps    Theraband Level (Shoulder External Rotation) Level 3 (Green)    Extension Both;20 reps    Theraband Level (Shoulder Extension) Level 3 (Green)    Extension Limitations palms up    [employer] reps     Theraband Level (Shoulder Row) Level 3 (Green)    Other Standing Exercises standing Y at wall green X20      Modalities   Modalities Traction      Traction   Type of Traction Cervical    Min (lbs) 13    Max (lbs) 18    Hold Time 60    Rest Time 20    Time 20 min total                       PT Short Term Goals - 03/07/21 0835       PT SHORT TERM GOAL #1   Title Patient will demonstrate independent use of home exercise program to maintain progress from in clinic treatments.    Time 3    Period Weeks    Status New    Target Date 03/28/21               PT Long Term Goals - 03/07/21 0835       PT LONG TERM GOAL #1   Title Patient will demonstrate/report pain at  worst less than or equal to 2/10 to facilitate minimal limitation in daily activity secondary to pain symptoms.    Time 10    Period Weeks    Status New    Target Date 05/16/21      PT LONG TERM GOAL #2   Title Patient will demonstrate independent use of home exercise program to facilitate ability to maintain/progress functional gains from skilled physical therapy services.    Time 10    Period Weeks    Status New    Target Date 05/16/21      PT LONG TERM GOAL #3   Title Pt. will demonstrate FOTO outcome > or = 63% to indicated reduced disability due to condition.    Time 10    Period Weeks    Status New    Target Date 05/16/21      PT LONG TERM GOAL #4   Title Pt. will demonstrate cervical AROM WFL s symptoms to facilitate usual mobility at PLOF s limitation.    Time 10    Period Weeks    Status New    Target Date 05/16/21      PT LONG TERM GOAL #5   Title Pt. will report/demonstrate ability to perform usual workouts, sleep at PLOF s restriction.    Time 10    Period Weeks    Status New    Target Date 05/16/21                   Plan - 04/03/21 0903     Clinical Impression Statement Trialed mechanical traction today due to continued radicular complaints in left arm.  This was in combo with scapular strengthening program and he does report some early response from mechanical traction. We will assess his longer term response from this next visit.    Examination-Activity Limitations Sleep;Bend;Lift    Examination-Participation Restrictions Driving;Occupation;Community Activity    Stability/Clinical Decision Making Stable/Uncomplicated    Rehab Potential Good    PT Frequency --   1-2x/week   PT Duration Other (comment)   10 weeks   PT Treatment/Interventions ADLs/Self Care Home Management;Electrical Stimulation;Cryotherapy;Iontophoresis 4mg /ml Dexamethasone;Moist Heat;Traction;Balance training;Therapeutic exercise;Therapeutic activities;Functional mobility training;Stair training;Gait training;DME Instruction;Ultrasound;Neuromuscular re-education;Patient/family education;Passive range of motion;Spinal Manipulations;Joint Manipulations;Dry needling;Taping;Manual techniques    PT Next Visit Plan how was traction? DN if desired, continued cervical rotation improvements.  Presentation indicates more myofascial referred pain vs. cervical radicular symptoms (no change in Lt arm symptoms noted c any cervical provocative testing)    PT Home Exercise Plan [id number]    Consulted and Agree with Plan of Care Patient             Patient will benefit from skilled therapeutic intervention in order to improve the following deficits and impairments:  Hypomobility, Pain, Impaired UE functional use, Decreased strength, Increased muscle spasms, Increased fascial restricitons, Decreased activity tolerance, Decreased mobility, Improper body mechanics, Impaired perceived functional ability, Impaired flexibility, Postural dysfunction, Decreased coordination, Decreased range of motion  Visit Diagnosis: Cervicalgia  Pain in left arm  Abnormal posture     Problem List Patient Active Problem List   Diagnosis Date Noted   Dyslipidemia 01/31/2021   Prediabetes 01/31/2021   OSA  on CPAP 01/31/2021   Chronic neck pain 01/31/2021    Debbe Odea, PT,DPT 04/03/2021, 9:08 AM  [facility] Physical Therapy [address] [place], [place], [address] Phone: [phone]   Fax:  [phone]  Name: Daniel Hayden MRN: [id number] Date of Birth: September 13, 1965

## 2021-04-08 ENCOUNTER — Encounter: Payer: Self-pay | Admitting: Emergency Medicine

## 2021-04-10 ENCOUNTER — Ambulatory Visit: Payer: Managed Care, Other (non HMO) | Admitting: Physical Therapy

## 2021-04-10 ENCOUNTER — Other Ambulatory Visit: Payer: Self-pay

## 2021-04-10 ENCOUNTER — Ambulatory Visit: Payer: Managed Care, Other (non HMO) | Admitting: Emergency Medicine

## 2021-04-10 DIAGNOSIS — M542 Cervicalgia: Secondary | ICD-10-CM | POA: Diagnosis not present

## 2021-04-10 DIAGNOSIS — M79602 Pain in left arm: Secondary | ICD-10-CM

## 2021-04-10 DIAGNOSIS — R293 Abnormal posture: Secondary | ICD-10-CM

## 2021-04-10 NOTE — Therapy (Addendum)
[facility] Physical Therapy [address] [place], [place], [address] Phone: [phone]   Fax:  [phone]  Physical Therapy Treatment /Discharge   Patient Details  Name: Daniel Hayden MRN: [id number] Date of Birth: [birth year] Referring Provider (PT): Tyrell Antonio, MD   Encounter Date: 04/10/2021   PT End of Session - 04/10/21 0820     Visit Number 4    Number of Visits 20    Date for PT Re-Evaluation 05/16/21    Authorization Type [employer] $50 copay, 30 combined visits per year    Authorization - Visit Number 4    Authorization - Number of Visits 30    Progress Note Due on Visit 10    PT Start Time 0811    PT Stop Time 0849    PT Time Calculation (min) 38 min    Activity Tolerance Patient tolerated treatment well    Behavior During Therapy [facility] for tasks assessed/performed             No past medical history on file.  No past surgical history on file.  There were no vitals filed for this visit.   Subjective Assessment - 04/10/21 0817     Subjective Pt. relays his pain has improved some since last visit with traction, he can tell he has more mobility and he feels less radicular symptoms into his Lt arm. He also states he has some more help at work which has relieved a little stress but work is still overall stressful for him.    Pertinent History MRI ordered, trigger point injections performed    Diagnostic tests xrays: degenerative changes    Patient Stated Goals Reduce pain continue workouts    Pain Onset More than a month ago                [facility] PT Assessment - 04/10/21 0001       Assessment   Medical Diagnosis M54.12 (ICD-10-CM) - Radiculopathy, cervical region  M50.30 (ICD-10-CM) - Other cervical disc degeneration, unspecified cervical region  M25.512,G89.29 (ICD-10-CM) - Chronic left shoulder pain    Referring Provider (PT) Tyrell Antonio, MD    Onset Date/Surgical Date 08/14/20      AROM   Cervical Flexion 50    Cervical  Extension 60    Cervical - Right Side Bend 45    Cervical - Left Side Bend 45    Cervical - Right Rotation 63    Cervical - Left Rotation 62                           [facility] Adult PT Treatment/Exercise - 04/10/21 0001       Shoulder Exercises: Seated   Other Seated Exercises seated thoracic extension over chair back with 1/2 foam roll maintaining chin tuck X10 reps holding 5 sec      Shoulder Exercises: Standing   Horizontal ABduction Both;20 reps    Theraband Level (Shoulder Horizontal ABduction) Level 3 (Green)    External Rotation Both;20 reps    Theraband Level (Shoulder External Rotation) Level 3 (Green)    Extension Both;20 reps    Theraband Level (Shoulder Extension) Level 4 (Blue)    Extension Limitations palms up    Row Both;20 reps    Theraband Level (Shoulder Row) Level 4 (Blue)    Other Standing Exercises Lat pulldown blue X20      Traction   Type of Traction Cervical    Min (lbs) 14  Max (lbs) 19    Hold Time 60    Rest Time 20    Time 15 min                       PT Short Term Goals - 04/10/21 0901       PT SHORT TERM GOAL #1   Title Patient will demonstrate independent use of home exercise program to maintain progress from in clinic treatments.    Time 3    Period Weeks    Status Achieved    Target Date 03/28/21               PT Long Term Goals - 03/07/21 0835       PT LONG TERM GOAL #1   Title Patient will demonstrate/report pain at worst less than or equal to 2/10 to facilitate minimal limitation in daily activity secondary to pain symptoms.    Time 10    Period Weeks    Status New    Target Date 05/16/21      PT LONG TERM GOAL #2   Title Patient will demonstrate independent use of home exercise program to facilitate ability to maintain/progress functional gains from skilled physical therapy services.    Time 10    Period Weeks    Status New    Target Date 05/16/21      PT LONG TERM GOAL #3   Title  Pt. will demonstrate FOTO outcome > or = 63% to indicated reduced disability due to condition.    Time 10    Period Weeks    Status New    Target Date 05/16/21      PT LONG TERM GOAL #4   Title Pt. will demonstrate cervical AROM WFL s symptoms to facilitate usual mobility at PLOF s limitation.    Time 10    Period Weeks    Status New    Target Date 05/16/21      PT LONG TERM GOAL #5   Title Pt. will report/demonstrate ability to perform usual workouts, sleep at PLOF s restriction.    Time 10    Period Weeks    Status New    Target Date 05/16/21                   Plan - 04/10/21 0857     Clinical Impression Statement Continued mechanical cervical traction today with increased pull as he had great response with this last time. This was followed with scapular mobility and strengthening program with good overall tolerance and without complaints. Overall he is having more neck ROM and less pain. Continue POC.    Examination-Activity Limitations Sleep;Bend;Lift    Examination-Participation Restrictions Driving;Occupation;Community Activity    Stability/Clinical Decision Making Stable/Uncomplicated    Rehab Potential Good    PT Frequency --   1-2x/week   PT Duration Other (comment)   10 weeks   PT Treatment/Interventions ADLs/Self Care Home Management;Electrical Stimulation;Cryotherapy;Iontophoresis 4mg /ml Dexamethasone;Moist Heat;Traction;Balance training;Therapeutic exercise;Therapeutic activities;Functional mobility training;Stair training;Gait training;DME Instruction;Ultrasound;Neuromuscular re-education;Patient/family education;Passive range of motion;Spinal Manipulations;Joint Manipulations;Dry needling;Taping;Manual techniques    PT Next Visit Plan how was traction? DN if desired, continued cervical rotation improvements.  Presentation indicates more myofascial referred pain vs. cervical radicular symptoms (no change in Lt arm symptoms noted c any cervical provocative  testing)    PT Home Exercise Plan [id number]    Consulted and Agree with Plan of Care Patient  Patient will benefit from skilled therapeutic intervention in order to improve the following deficits and impairments:  Hypomobility, Pain, Impaired UE functional use, Decreased strength, Increased muscle spasms, Increased fascial restricitons, Decreased activity tolerance, Decreased mobility, Improper body mechanics, Impaired perceived functional ability, Impaired flexibility, Postural dysfunction, Decreased coordination, Decreased range of motion  Visit Diagnosis: Cervicalgia  Pain in left arm  Abnormal posture     Problem List Patient Active Problem List   Diagnosis Date Noted   Dyslipidemia 01/31/2021   Prediabetes 01/31/2021   OSA on CPAP 01/31/2021   Chronic neck pain 01/31/2021    Debbe Odea, PT,DPT 04/10/2021, 9:03 AM  PHYSICAL THERAPY DISCHARGE SUMMARY  Visits from Start of Care: 4  Current functional level related to goals / functional outcomes: See note   Remaining deficits: See note   Education / Equipment: HEP   Patient agrees to discharge. Patient goals were partially met. Patient is being discharged due to not returning since the last visit.  Scot Jun, PT, DPT, OCS, ATC 06/05/21  3:20 PM     [facility] [address] [place], [place], [address] Phone: [phone]   Fax:  [phone]  Name: Beverley Sherrard MRN: [id number] Date of Birth: 05/05/1966

## 2021-04-10 NOTE — Telephone Encounter (Signed)
Sent pt my chart message, advising him to make an OV.

## 2021-04-10 NOTE — Telephone Encounter (Signed)
Recommend to go to urgent care center and be seen.

## 2021-04-24 ENCOUNTER — Encounter: Payer: Self-pay | Admitting: Emergency Medicine

## 2021-04-24 ENCOUNTER — Other Ambulatory Visit: Payer: Self-pay

## 2021-04-24 ENCOUNTER — Ambulatory Visit: Payer: Managed Care, Other (non HMO) | Admitting: Emergency Medicine

## 2021-04-24 VITALS — BP 138/80 | HR 76 | Temp 98.4°F | Ht 67.0 in | Wt 204.0 lb

## 2021-04-24 DIAGNOSIS — R21 Rash and other nonspecific skin eruption: Secondary | ICD-10-CM

## 2021-04-24 MED ORDER — TRIAMCINOLONE ACETONIDE 0.1 % EX CREA: 1.0000 "application " | TOPICAL_CREAM | Freq: Two times a day (BID) | CUTANEOUS | 1 refills | Status: AC

## 2021-04-24 NOTE — Patient Instructions (Signed)
Rash, Adult  A rash is a change in the color of your skin. A rash can also change the way your skin feels. There are many different conditions and factors that can causea rash. Follow these instructions at home: The goal of treatment is to stop the itching and keep the rash from spreading. Watch for any changes in your symptoms. Let your doctor know about them. Followthese instructions to help with your condition: Medicine Take or apply over-the-counter and prescription medicines only as told by your doctor. These may include medicines: To treat red or swollen skin (corticosteroid creams). To treat itching. To treat an allergy (oral antihistamines). To treat very bad symptoms (oral corticosteroids).  Skin care Put cool cloths (compresses) on the affected areas. Do not scratch or rub your skin. Avoid covering the rash. Make sure that the rash is exposed to air as much as possible. Managing itching and discomfort Avoid hot showers or baths. These can make itching worse. A cold shower may help. Try taking a bath with: Epsom salts. You can get these at your local pharmacy or grocery store. Follow the instructions on the package. Baking soda. Pour a small amount into the bath as told by your doctor. Colloidal oatmeal. You can get this at your local pharmacy or grocery store. Follow the instructions on the package. Try putting baking soda paste onto your skin. Stir water into baking soda until it gets like a paste. Try putting on a lotion that relieves itchiness (calamine lotion). Keep cool and out of the sun. Sweating and being hot can make itching worse. General instructions  Rest as needed. Drink enough fluid to keep your pee (urine) pale yellow. Wear loose-fitting clothing. Avoid scented soaps, detergents, and perfumes. Use gentle soaps, detergents, perfumes, and other cosmetic products. Avoid anything that causes your rash. Keep a journal to help track what causes your rash. Write  down: What you eat. What cosmetic products you use. What you drink. What you wear. This includes jewelry. Keep all follow-up visits as told by your doctor. This is important.  Contact a doctor if: You sweat at night. You lose weight. You pee (urinate) more than normal. You pee less than normal, or you notice that your pee is a darker color than normal. You feel weak. You throw up (vomit). Your skin or the whites of your eyes look yellow (jaundice). Your skin: Tingles. Is numb. Your rash: Does not go away after a few days. Gets worse. You are: More thirsty than normal. More tired than normal. You have: New symptoms. Pain in your belly (abdomen). A fever. Watery poop (diarrhea). Get help right away if: You have a fever and your symptoms suddenly get worse. You start to feel mixed up (confused). You have a very bad headache or a stiff neck. You have very bad joint pains or stiffness. You have jerky movements that you cannot control (seizure). Your rash covers all or most of your body. The rash may or may not be painful. You have blisters that: Are on top of the rash. Grow larger. Grow together. Are painful. Are inside your nose or mouth. You have a rash that: Looks like purple pinprick-sized spots all over your body. Has a "bull's eye" or looks like a target. Is red and painful, causes your skin to peel, and is not from being in the sun too long. Summary A rash is a change in the color of your skin. A rash can also change the way your skin feels.   The goal of treatment is to stop the itching and keep the rash from spreading. Take or apply over-the-counter and prescription medicines only as told by your doctor. Contact a doctor if you have new symptoms or symptoms that get worse. Keep all follow-up visits as told by your doctor. This is important. This information is not intended to replace advice given to you by your health care provider. Make sure you discuss any  questions you have with your healthcare provider. Document Revised: 09/24/2018 Document Reviewed: 01/04/2018 Elsevier Patient Education  2022 [employer]  

## 2021-04-24 NOTE — Progress Notes (Signed)
Daniel Hayden 55 y.o.   Chief Complaint  Patient presents with   Rash    Dark rash on chest and lower back, pt states he has eczema, he states when he eats diary products he breaks out with rash.x 2 wks    HISTORY OF PRESENT ILLNESS: This is a 55 y.o. male complaining of rash for the past 2 weeks exacerbated by certain types of food in particular cheese.  Takes Benadryl when he gets better.  No other associated symptoms. No other complaints or medical concerns today No new medications.  Still taking medications for high triglycerides and diabetes.  Rash Pertinent negatives include no congestion, cough, diarrhea, fever, shortness of breath, sore throat or vomiting.    Prior to Admission medications   Medication Sig Start Date End Date Taking? Authorizing Provider  atorvastatin (LIPITOR) 80 MG tablet Take 80 mg by mouth daily.   Yes [provider]  cyclobenzaprine (FLEXERIL) 10 MG tablet Take 1 tablet (10 mg total) by mouth at bedtime. 01/31/21  Yes Toben Acuna, Ines Bloomer, MD  ezetimibe (ZETIA) 10 MG tablet Take 10 mg by mouth daily.   Yes [provider]  Icosapent Ethyl (VASCEPA PO) Take by mouth.   Yes [provider]  metFORMIN (GLUCOPHAGE) 500 MG tablet Take 1 tablet (500 mg total) by mouth 2 (two) times daily with a meal. 02/02/21  Yes Saachi Zale, Ines Bloomer, MD    Allergies  Allergen Reactions   Cheese Rash    Cheese/dairy    Patient Active Problem List   Diagnosis Date Noted   Dyslipidemia 01/31/2021   Prediabetes 01/31/2021   OSA on CPAP 01/31/2021   Chronic neck pain 01/31/2021    No past medical history on file.  No past surgical history on file.  Social History   Socioeconomic History   Marital status: Married    Spouse name: Not on file   Number of children: Not on file   Years of education: Not on file   Highest education level: Not on file  Occupational History   Not on file  Tobacco Use   Smoking status: Never   Smokeless  tobacco: Never  Substance and Sexual Activity   Alcohol use: Not on file   Drug use: Not on file   Sexual activity: Not on file  Other Topics Concern   Not on file  Social History Narrative   Not on file   Social Determinants of Health   Financial Resource Strain: Not on file  Food Insecurity: Not on file  Transportation Needs: Not on file  Physical Activity: Not on file  Stress: Not on file  Social Connections: Not on file  Intimate Partner Violence: Not on file    No family history on file.   Review of Systems  Constitutional: Negative.  Negative for chills and fever.  HENT: Negative.  Negative for congestion and sore throat.   Respiratory: Negative.  Negative for cough and shortness of breath.   Cardiovascular: Negative.  Negative for chest pain and palpitations.  Gastrointestinal: Negative.  Negative for abdominal pain, blood in stool, diarrhea, nausea and vomiting.  Genitourinary: Negative.   Skin:  Positive for rash.  Neurological:  Negative for dizziness and headaches.  All other systems reviewed and are negative. Today's Vitals   04/24/21 1312  BP: 138/80  Pulse: 76  Temp: 98.4 F (36.9 C)  TempSrc: Oral  SpO2: 96%  Weight: 204 lb (92.5 kg)  Height: 5\' 7"  (1.702 m)   Body mass  index is 31.95 kg/m.   Physical Exam Vitals reviewed.  Constitutional:      Appearance: Normal appearance.  HENT:     Head: Normocephalic.  Eyes:     Extraocular Movements: Extraocular movements intact.     Pupils: Pupils are equal, round, and reactive to light.  Cardiovascular:     Rate and Rhythm: Normal rate.  Pulmonary:     Effort: Pulmonary effort is normal.  Musculoskeletal:        General: Normal range of motion.     Cervical back: Normal range of motion.  Skin:    General: Skin is warm and dry.     Capillary Refill: Capillary refill takes less than 2 seconds.     Findings: Rash present.     Comments: Similar rashes to mid chest area and pilonidal area.  See  picture below.  Neurological:     General: No focal deficit present.     Mental Status: He is alert and oriented to person, place, and time.  Psychiatric:        Mood and Affect: Mood normal.        Behavior: Behavior normal.      ASSESSMENT & PLAN: Problem List Items Addressed This Visit   None Visit Diagnoses     Rash and nonspecific skin eruption    -  Primary   Relevant Medications   triamcinolone cream (KENALOG) 0.1 %   Other Relevant Orders   Ambulatory referral to Dermatology      Patient Instructions  Rash, Adult A rash is a change in the color of your skin. A rash can also change the way your skin feels. There are many different conditions and factors that can cause a rash. Follow these instructions at home: The goal of treatment is to stop the itching and keep the rash from spreading. Watch for any changes in your symptoms. Let your doctor know about them. Follow these instructions to help with your condition: Medicine Take or apply over-the-counter and prescription medicines only as told by your doctor. These may include medicines: To treat red or swollen skin (corticosteroid creams). To treat itching. To treat an allergy (oral antihistamines). To treat very bad symptoms (oral corticosteroids).  Skin care Put cool cloths (compresses) on the affected areas. Do not scratch or rub your skin. Avoid covering the rash. Make sure that the rash is exposed to air as much as possible. Managing itching and discomfort Avoid hot showers or baths. These can make itching worse. A cold shower may help. Try taking a bath with: Epsom salts. You can get these at your local pharmacy or grocery store. Follow the instructions on the package. Baking soda. Pour a small amount into the bath as told by your doctor. Colloidal oatmeal. You can get this at your local pharmacy or grocery store. Follow the instructions on the package. Try putting baking soda paste onto your skin. Stir water  into baking soda until it gets like a paste. Try putting on a lotion that relieves itchiness (calamine lotion). Keep cool and out of the sun. Sweating and being hot can make itching worse. General instructions  Rest as needed. Drink enough fluid to keep your pee (urine) pale yellow. Wear loose-fitting clothing. Avoid scented soaps, detergents, and perfumes. Use gentle soaps, detergents, perfumes, and other cosmetic products. Avoid anything that causes your rash. Keep a journal to help track what causes your rash. Write down: What you eat. What cosmetic products you use. What you drink.  What you wear. This includes jewelry. Keep all follow-up visits as told by your doctor. This is important. Contact a doctor if: You sweat at night. You lose weight. You pee (urinate) more than normal. You pee less than normal, or you notice that your pee is a darker color than normal. You feel weak. You throw up (vomit). Your skin or the whites of your eyes look yellow (jaundice). Your skin: Tingles. Is numb. Your rash: Does not go away after a few days. Gets worse. You are: More thirsty than normal. More tired than normal. You have: New symptoms. Pain in your belly (abdomen). A fever. Watery poop (diarrhea). Get help right away if: You have a fever and your symptoms suddenly get worse. You start to feel mixed up (confused). You have a very bad headache or a stiff neck. You have very bad joint pains or stiffness. You have jerky movements that you cannot control (seizure). Your rash covers all or most of your body. The rash may or may not be painful. You have blisters that: Are on top of the rash. Grow larger. Grow together. Are painful. Are inside your nose or mouth. You have a rash that: Looks like purple pinprick-sized spots all over your body. Has a "bull's eye" or looks like a target. Is red and painful, causes your skin to peel, and is not from being in the sun too  long. Summary A rash is a change in the color of your skin. A rash can also change the way your skin feels. The goal of treatment is to stop the itching and keep the rash from spreading. Take or apply over-the-counter and prescription medicines only as told by your doctor. Contact a doctor if you have new symptoms or symptoms that get worse. Keep all follow-up visits as told by your doctor. This is important. This information is not intended to replace advice given to you by your health care provider. Make sure you discuss any questions you have with your health care provider. Document Revised: 09/24/2018 Document Reviewed: 01/04/2018 Elsevier Patient Education  2022 [place], MD [facility]

## 2021-05-13 ENCOUNTER — Other Ambulatory Visit: Payer: Self-pay

## 2021-05-13 ENCOUNTER — Encounter (HOSPITAL_BASED_OUTPATIENT_CLINIC_OR_DEPARTMENT_OTHER): Payer: Self-pay | Admitting: Emergency Medicine

## 2021-05-13 ENCOUNTER — Emergency Department (HOSPITAL_BASED_OUTPATIENT_CLINIC_OR_DEPARTMENT_OTHER): Payer: Managed Care, Other (non HMO)

## 2021-05-13 ENCOUNTER — Emergency Department (HOSPITAL_BASED_OUTPATIENT_CLINIC_OR_DEPARTMENT_OTHER)
Admission: EM | Admit: 2021-05-13 | Discharge: 2021-05-14 | Disposition: A | Discharge status: Home / Self Care | Payer: Managed Care, Other (non HMO) | Attending: Emergency Medicine | Admitting: Emergency Medicine

## 2021-05-13 ENCOUNTER — Emergency Department (HOSPITAL_COMMUNITY): Payer: Managed Care, Other (non HMO)

## 2021-05-13 DIAGNOSIS — Z79899 Other long term (current) drug therapy: Secondary | ICD-10-CM | POA: Insufficient documentation

## 2021-05-13 DIAGNOSIS — Z7984 Long term (current) use of oral hypoglycemic drugs: Secondary | ICD-10-CM | POA: Insufficient documentation

## 2021-05-13 DIAGNOSIS — E119 Type 2 diabetes mellitus without complications: Secondary | ICD-10-CM | POA: Insufficient documentation

## 2021-05-13 DIAGNOSIS — R42 Dizziness and giddiness: Secondary | ICD-10-CM | POA: Diagnosis not present

## 2021-05-13 DIAGNOSIS — R809 Proteinuria, unspecified: Secondary | ICD-10-CM

## 2021-05-13 DIAGNOSIS — Z20822 Contact with and (suspected) exposure to covid-19: Secondary | ICD-10-CM | POA: Insufficient documentation

## 2021-05-13 HISTORY — DX: Type 2 diabetes mellitus without complications: E11.9

## 2021-05-13 HISTORY — DX: Hyperlipidemia, unspecified: E78.5

## 2021-05-13 LAB — BASIC METABOLIC PANEL
Anion gap: 9 (ref 5–15)
BUN: 12 mg/dL (ref 6–20)
CO2: 25 mmol/L (ref 22–32)
Calcium: 9.6 mg/dL (ref 8.9–10.3)
Chloride: 102 mmol/L (ref 98–111)
Creatinine, Ser: 0.78 mg/dL (ref 0.61–1.24)
GFR, Estimated: >60 mL/min (ref >60)
Glucose, Bld: 148 mg/dL — ABNORMAL HIGH (ref 70–99)
Potassium: 4 mmol/L (ref 3.5–5.1)
Sodium: 136 mmol/L (ref 135–145)

## 2021-05-13 LAB — CBC
HCT: 49.9 % (ref 39.0–52.0)
Hemoglobin: 15.7 g/dL (ref 13.0–17.0)
MCH: 25.4 pg — ABNORMAL LOW (ref 26.0–34.0)
MCHC: 31.5 g/dL (ref 30.0–36.0)
MCV: 80.7 fL (ref 80.0–100.0)
Platelets: 159 10*3/uL (ref 150–400)
RBC: 6.18 MIL/uL — ABNORMAL HIGH (ref 4.22–5.81)
RDW: 17.6 % — ABNORMAL HIGH (ref 11.5–15.5)
WBC: 4.9 10*3/uL (ref 4.0–10.5)
nRBC: 0 % (ref 0.0–0.2)

## 2021-05-13 LAB — RESP PANEL BY RT-PCR (FLU A&B, COVID) ARPGX2
Influenza A by PCR: NEGATIVE
Influenza B by PCR: NEGATIVE
SARS Coronavirus 2 by RT PCR: NEGATIVE

## 2021-05-13 LAB — URINALYSIS, ROUTINE W REFLEX MICROSCOPIC
Bilirubin Urine: NEGATIVE
Glucose, UA: NEGATIVE mg/dL
Hgb urine dipstick: NEGATIVE
Ketones, ur: NEGATIVE mg/dL
Leukocytes,Ua: NEGATIVE
Nitrite: NEGATIVE
Protein, ur: >300 mg/dL — AB
Specific Gravity, Urine: 1.028 (ref 1.005–1.030)
pH: 8.5 — ABNORMAL HIGH (ref 5.0–8.0)

## 2021-05-13 MED ORDER — IOHEXOL 350 MG/ML SOLN
75.0000 mL | Freq: Once | INTRAVENOUS | Status: AC | PRN
  Administered 2021-05-13: 14:00:00 75 mL via INTRAVENOUS

## 2021-05-13 MED ORDER — MECLIZINE HCL 25 MG PO TABS: 25.0000 mg | ORAL_TABLET | Freq: Three times a day (TID) | ORAL | 0 refills | Status: DC | PRN

## 2021-05-13 NOTE — ED Notes (Signed)
Pt in MRI.

## 2021-05-13 NOTE — ED Notes (Signed)
Patient reports to the ER for dizziness and is being transferred by POV to [facility] for MRI. Patient given a map to [facility] Emergency with address attached. IV secured lightly with coban in right AC for POV transfer. Charge Nurse Roselyn Reef made aware that patient is on the way and need for MRI. MD Vanita Panda accepting

## 2021-05-13 NOTE — ED Provider Notes (Signed)
[facility] EMERGENCY DEPT Provider Note   CSN: [id number] Arrival date & time: 05/13/21  1157     History Chief Complaint  Patient presents with   Dizziness    Daniel Hayden is a 55 y.o. male past medical history of diabetes, hyperlipidemia presents emergency department chief complaint of vertigo.  Patient states that he woke up this morning about 7:45 a.m., at 7:50 he took his normal medications and when he extended his neck back to take his medications he had sudden onset of room spinning dizziness.  Patient states at first it was mild, he got up and went to eat oatmeal this morning was having some difficulty walking and had to hold onto things.  He states he was able to eat oatmeal, went back to his bed and lie down for about an hour.  He woke back up, ate a banana and noticed that he was still spinning.  He decided he was going to go to his car and try to go on with his day.  When he opened the garage he said the light hit him and he got very dizzy, fell back and hit the wall, went straight back inside and sat down.  He states that he then suddenly got nauseated and vomited.  They called his PCP and he came here.He states that his symptoms are worsened when he stands up, better at rest but not totally resolved at any point.   He has had vertigo once before about a year ago.  It was sudden and lasted only a few minutes.  He is currently taking metformin which is new in his medication regimen.  He denies fevers, chills, nausea, vomiting.  He denies headache, changes in vision, unilateral weakness, difficulty with speech or swallowing.  He has no previous history of CAD or PAD and no history of TIA or stroke.  He has fairly severe hypertriglyceridemia and is on multiple medications for his cholesterol.  He denies changes in hearing or tinnitus.   He states that his symptoms are completely resolved at this time.   Dizziness     Past Medical History:  Diagnosis Date   Diabetes  mellitus without complication ([place])    Hyperlipidemia     Patient Active Problem List   Diagnosis Date Noted   Dyslipidemia 01/31/2021   Prediabetes 01/31/2021   OSA on CPAP 01/31/2021   Chronic neck pain 01/31/2021    Past Surgical History:  Procedure Laterality Date   HERNIA REPAIR         History reviewed. No pertinent family history.  Social History   Tobacco Use   Smoking status: Never   Smokeless tobacco: Never  Substance Use Topics   Drug use: Not Currently    Home Medications Prior to Admission medications   Medication Sig Start Date End Date Taking? Authorizing Provider  atorvastatin (LIPITOR) 80 MG tablet Take 80 mg by mouth daily.    [provider]  cyclobenzaprine (FLEXERIL) 10 MG tablet Take 1 tablet (10 mg total) by mouth at bedtime. 01/31/21   Horald Pollen, MD  ezetimibe (ZETIA) 10 MG tablet Take 10 mg by mouth daily.    [provider]  Icosapent Ethyl (VASCEPA PO) Take by mouth.    [provider]  metFORMIN (GLUCOPHAGE) 500 MG tablet Take 1 tablet (500 mg total) by mouth 2 (two) times daily with a meal. 02/02/21   Sagardia, Ines Bloomer, MD  triamcinolone cream (KENALOG) 0.1 % Apply 1 application topically 2 (two)  times daily. 04/24/21   Horald Pollen, MD    Allergies    Cheese  Review of Systems   Review of Systems  Neurological:  Positive for dizziness.  Ten systems reviewed and are negative for acute change, except as noted in the HPI.   Physical Exam Updated Vital Signs BP (!) 135/99 (BP Location: Left Arm)   Pulse 74   Temp 98.1 F (36.7 C) (Oral)   Resp 18   Ht 5\' 5"  (1.651 m)   Wt 87.1 kg   SpO2 97%   BMI 31.95 kg/m   Physical Exam Vitals and nursing note reviewed.  Constitutional:      General: He is not in acute distress.    Appearance: He is well-developed. He is not diaphoretic.  HENT:     Head: Normocephalic and atraumatic.  Eyes:     General: No scleral icterus.     Conjunctiva/sclera: Conjunctivae normal.  Cardiovascular:     Rate and Rhythm: Normal rate and regular rhythm.     Heart sounds: Normal heart sounds.  Pulmonary:     Effort: Pulmonary effort is normal. No respiratory distress.     Breath sounds: Normal breath sounds.  Abdominal:     Palpations: Abdomen is soft.     Tenderness: There is no abdominal tenderness.  Musculoskeletal:     Cervical back: Normal range of motion and neck supple.  Skin:    General: Skin is warm and dry.  Neurological:     Mental Status: He is alert.     Comments: Speech is clear and goal oriented, follows commands Major Cranial nerves without deficit, no facial droop Normal strength in upper and lower extremities bilaterally including dorsiflexion and plantar flexion, strong and equal grip strength Sensation normal to light and sharp touch Moves extremities without ataxia, coordination intact Normal finger to nose and rapid alternating movements Neg romberg, no pronator drift Normal gait Normal heel-shin and balance   Psychiatric:        Behavior: Behavior normal.    ED Results / Procedures / Treatments   Labs (all labs ordered are listed, but only abnormal results are displayed) Labs Reviewed  BASIC METABOLIC PANEL - Abnormal; Notable for the following components:      Result Value   Glucose, Bld 148 (*)    All other components within normal limits  CBC - Abnormal; Notable for the following components:   RBC 6.18 (*)    MCH 25.4 (*)    RDW 17.6 (*)    All other components within normal limits  URINALYSIS, ROUTINE W REFLEX MICROSCOPIC - Abnormal; Notable for the following components:   pH 8.5 (*)    Protein, ur >300 (*)    All other components within normal limits  RESP PANEL BY RT-PCR (FLU A&B, COVID) ARPGX2  CBG MONITORING, ED    EKG EKG Interpretation  Date/Time:  Monday May 13 2021 12:14:12 EST Ventricular Rate:  75 PR Interval:  148 QRS Duration: 82 QT Interval:  380 QTC  Calculation: 424 R Axis:   6 Text Interpretation: Normal sinus rhythm Septal infarct , age undetermined Abnormal ECG Confirmed by Lennice Sites [phone]) on 05/13/2021 12:22:37 PM  Radiology No results found.  Procedures Procedures   Medications Ordered in ED Medications - No data to display  ED Course  I have reviewed the triage vital signs and the nursing notes.  Pertinent labs & imaging results that were available during my care of the patient were reviewed by  me and considered in my medical decision making (see chart for details).    MDM Rules/Calculators/A&P 56 year old male here with complaint of vertigo 55 year old male here with complaint of vertigo The emergent differential diagnosis for acute vertigo low includes peripheral causes such as BPPV, barotrauma, ear foreign body, Mnire's disease, infectious causes such as lip bronchitis, vestibular neuritis or Ramsay Hunt syndrome.  Other emergent causes are central such as cerebellar stroke, vertebrobasilar insufficiency, neoplastic causes, vertebral artery dissection, MS, neurosyphilis or tuberculosis, epilepsy or migraine.  Other causes include anemia, hyperviscosity syndrome, alcohol or aminoglycoside use, renal failure, hypoglycemia and thyroid disease. I ordered and reviewed labs that included CBC which shows no significant abnormality, urine with greater than 300 of protein, BMP with elevated blood glucose at 148, respiratory panel negative for COVID or influenza.  I ordered and reviewed images of a CT angiogram of the head and neck patient has no acute intracranial pathology or large vessel occlusions but has mild stenosis in the right M2. Patient offered outpatient versus emergent MRI for definitive diagnosis of stroke versus peripheral vertigo.  Patient has opted to continue and get his MRI done today.  He will go by POV to [facility].  I have discussed the case with Dr. Vanita Panda who is excepting.  Patient also noted to have  proteinuria which she will need to follow-up with his primary care for reevaluation of his urine.  He does have increasing risk factors for vessel disease including hyperglycemia and hypertriglyceridemia. Final Clinical Impression(s) / ED Diagnoses Final diagnoses:  None    Rx / DC Orders ED Discharge Orders     None        Margarita Mail, PA-C 05/13/21 [place], [place], DO 05/14/21 [phone]

## 2021-05-13 NOTE — ED Notes (Signed)
Pt called multiple times no answer 

## 2021-05-13 NOTE — ED Notes (Signed)
Pt given Saltine crackers and cranberry drink, per [employer] - RN

## 2021-05-13 NOTE — ED Triage Notes (Signed)
Pt via ems from home with n/v/dizziness since awakening this morning. LKW was 2330 last night, although he did have a headache. Pt states he had an similar episode a year ago and all tests were negative. He believes it had something to do with metformin, so he quit taking it, but started taking it again about 2 weeks ago. Pt alert & oriented, nad noted.

## 2021-05-13 NOTE — ED Triage Notes (Signed)
Ems reports pt went to bed with headache last night; awakened with n/v/dizziness. Tried to eat but that made it worse. Pt's daughter was in town over the weekend with uri symptoms. EMS reports 16 pt orthostatic drop; last bpp 144/100 HR 76 98% RA and CBG 150. ECG unremarkable.

## 2021-05-14 NOTE — ED Notes (Signed)
Notified by Charge Nurse Roselyn Reef, RN at [facility] that MRI has not been ordered on pt. Will notify Abigail, PA to order MRI as pt has arrived at [facility] via [place].

## 2021-05-15 ENCOUNTER — Encounter (HOSPITAL_BASED_OUTPATIENT_CLINIC_OR_DEPARTMENT_OTHER): Payer: Self-pay | Admitting: Internal Medicine

## 2021-05-15 ENCOUNTER — Ambulatory Visit (HOSPITAL_BASED_OUTPATIENT_CLINIC_OR_DEPARTMENT_OTHER): Payer: Managed Care, Other (non HMO) | Admitting: Internal Medicine

## 2021-05-15 ENCOUNTER — Encounter: Payer: Self-pay | Admitting: Emergency Medicine

## 2021-05-15 ENCOUNTER — Telehealth: Payer: Self-pay | Admitting: Internal Medicine

## 2021-05-15 ENCOUNTER — Other Ambulatory Visit: Payer: Self-pay

## 2021-05-15 VITALS — BP 114/80 | HR 79 | Ht 65.0 in | Wt 206.0 lb

## 2021-05-15 DIAGNOSIS — E783 Hyperchylomicronemia: Secondary | ICD-10-CM | POA: Diagnosis not present

## 2021-05-15 NOTE — Progress Notes (Signed)
LIPID CLINIC CONSULT NOTE  Chief Complaint:  High triglycerides  Primary Care Physician: Horald Pollen, MD  Primary Cardiologist:  None  HPI:  Daniel Hayden is a 55 y.o. male who is being seen today for the evaluation of triglycerides at the request of Horald Pollen, *.  This is a pleasant 55 year old male who recently moved to the area with his wife from [place].  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  He reports longstanding history of high triglycerides and was previously getting cardiac care from Dr. Ginnie Smart with [facility] cardiology in [place].  Most recently lipids show total cholesterol 157, triglycerides 1316, HDL's 16 and LDL 22.  He reports his triglycerides has been as high as 2200 in the past.  He is not aware of a family history of high triglycerides, but knows that he has had lifelong high triglycerides despite dietary changes and medications.  He was unfortunately just in the emergency department today with symptoms concerning for vertigo.  He underwent extensive testing. Apparently he has had prior cardiac work-up by his cardiologist in [place] and we will try to obtain those records.  Medical regimen appears to be good with atorvastatin 80 mg daily, ezetimibe 10 mg daily and Vascepa 2 g twice daily.  Was on fenofibrate but was stopped because it was felt to be "not effective".  PMHx:  Past Medical History:  Diagnosis Date   Diabetes mellitus without complication ([place])    Hyperlipidemia     Past Surgical History:  Procedure Laterality Date   HERNIA REPAIR      FAMHx:  Family History  Problem Relation Age of Onset   Pancreatic cancer Sister    Leukemia Sister    Other Brother    Other Son    Asthma Daughter     SOCHx:   reports that he has never smoked. He has never used smokeless tobacco. He reports that he does not currently use drugs. No history on file for alcohol use.  ALLERGIES:  Allergies   Allergen Reactions   Cheese Rash    Cheese/dairy    ROS: Pertinent items noted in HPI and remainder of comprehensive ROS otherwise negative.  HOME MEDS: Current Outpatient Medications on File Prior to Visit  Medication Sig Dispense Refill   atorvastatin (LIPITOR) 80 MG tablet Take 80 mg by mouth daily.     cyclobenzaprine (FLEXERIL) 10 MG tablet Take 1 tablet (10 mg total) by mouth at bedtime. 30 tablet 1   ezetimibe (ZETIA) 10 MG tablet Take 10 mg by mouth daily.     Icosapent Ethyl (VASCEPA PO) Take 2 capsules by mouth in the morning and at bedtime.     icosapent Ethyl (VASCEPA) 1 g capsule Take 2 g by mouth 2 (two) times daily.     meclizine (ANTIVERT) 25 MG tablet Take 1 tablet (25 mg total) by mouth 3 (three) times daily as needed for dizziness. 30 tablet 0   metFORMIN (GLUCOPHAGE) 500 MG tablet Take 1 tablet (500 mg total) by mouth 2 (two) times daily with a meal. 180 tablet 3   triamcinolone cream (KENALOG) 0.1 % Apply 1 application topically 2 (two) times daily. 30 g 1   No current facility-administered medications on file prior to visit.    LABS/IMAGING: No results found for this or any previous visit (from the past 48 hour(s)). No results found.  LIPID PANEL:    Component Value Date/Time   CHOL 157  02/01/2021 0814   TRIG (H) 02/01/2021 0814    1316.0 Triglyceride is over 400; calculations on Lipids are invalid.   HDL 16.60 (L) 02/01/2021 0814   CHOLHDL 9 02/01/2021 0814   LDLDIRECT 22.0 02/01/2021 0814    WEIGHTS: Wt Readings from Last 3 Encounters:  05/15/21 206 lb (93.4 kg)  05/13/21 192 lb (87.1 kg)  04/24/21 204 lb (92.5 kg)    VITALS: BP 114/80   Pulse 79   Ht 5\' 5"  (1.651 m)   Wt 206 lb (93.4 kg)   SpO2 95%   BMI 34.28 kg/m   EXAM: General appearance: alert and no distress Neck: no carotid bruit, no JVD, and thyroid not enlarged, symmetric, no tenderness/mass/nodules Lungs: clear to auscultation bilaterally Heart: regular rate and  rhythm Abdomen: soft, non-tender; bowel sounds normal; no masses,  no organomegaly Extremities: extremities normal, atraumatic, no cyanosis or edema Pulses: 2+ and symmetric Skin: Skin color, texture, turgor normal. 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  He reports longstanding high triglycerides which have been difficult to control.  He has no known cardiovascular disease.  Recent work-up for dizziness was unrevealing and only showed mild small vessel intracranial stenosis.  Would recommend genetic testing as I think this could be helpful to further identify the cause of his symptoms.  He also may be a good candidate for core clinical trial looking at the APO C3 inhibitor which significantly lowers triglycerides.  He expressed interest in the study and I will ask our research nurses to reach out to him.  Would otherwise continue his current medications.  If his triglycerides continue to rise higher, would add back fenofibrate for additional synergistic effect.  Plan follow-up with me in 3 months.  Thanks again for the kind referral.  Pixie Casino, MD, FACC, [place] Director of the [facility] Lipid [facility]  Cardiovascular Risk Reduction Clinic Diplomate of the American Board of Clinical Lipidology Attending Cardiologist  Direct Dial: [phone]  Fax: [phone]  Website:  [email or web address] 05/15/2021, 8:03 PM

## 2021-05-15 NOTE — Patient Instructions (Addendum)
Medication Instructions:  NO CHANGES  *If you need a refill on your cardiac medications before your next appointment, please call your pharmacy*   Lab Work:  FASTING lab work to check cholesterol   Genetic Test   Follow-Up: At [facility], you and your health needs are our priority.  As part of our continuing mission to provide you with exceptional heart care, we have created designated Provider Care Teams.  These Care Teams include your primary Cardiologist (physician) and Advanced Practice Providers (APPs -  Physician Assistants and Nurse Practitioners) who all work together to provide you with the care you need, when you need it.  We recommend signing up for the patient portal called "MyChart".  Sign up information is provided on this After Visit Summary.  MyChart is used to connect with patients for Virtual Visits (Telemedicine).  Patients are able to view lab/test results, encounter notes, upcoming appointments, etc.  Non-urgent messages can be sent to your provider as well.   To learn more about what you can do with MyChart, go to [email or web address].    Your next appointment:   3-4 months with Dr. Debara Pickett   Other Instructions  Dr. Debara Pickett will submit your name for a clinical trial

## 2021-05-15 NOTE — Telephone Encounter (Signed)
Genetic test for ASCVD/dyslipidemia ordered ([facility]) Cheek swab completed in office Specimen and necessary paperwork mailed. ID: [id number]

## 2021-05-17 ENCOUNTER — Other Ambulatory Visit: Payer: Self-pay | Admitting: Emergency Medicine

## 2021-05-17 DIAGNOSIS — E1169 Type 2 diabetes mellitus with other specified complication: Secondary | ICD-10-CM | POA: Insufficient documentation

## 2021-05-17 DIAGNOSIS — E1129 Type 2 diabetes mellitus with other diabetic kidney complication: Secondary | ICD-10-CM

## 2021-05-17 MED ORDER — DAPAGLIFLOZIN PROPANEDIOL 10 MG PO TABS: 10.0000 mg | ORAL_TABLET | Freq: Every day | ORAL | 3 refills | Status: DC

## 2021-05-17 MED ORDER — LISINOPRIL 5 MG PO TABS: 5.0000 mg | ORAL_TABLET | Freq: Every day | ORAL | 3 refills | Status: DC

## 2021-05-17 NOTE — Telephone Encounter (Signed)
He has diabetes with significant amount of protein in the urine.  This could be a sign of early diabetic kidney dysfunction and he may benefit from starting Farxiga 10 mg and low-dose lisinopril daily.  I will send a prescription to pharmacy of record.  He also needs to be evaluated by kidney doctor.  I will place a referral today.  Thanks.

## 2021-05-17 NOTE — Progress Notes (Signed)
Lab Results  Component Value Date   HGBA1C 6.9 (H) 02/01/2021   BP Readings from Last 3 Encounters:  05/15/21 114/80  05/13/21 (!) 132/118  04/24/21 138/80   Diabetic with microalbuminuria. Will benefit from low-dose lisinopril 5 mg and Farxiga 10 mg daily.  Prescription sent to pharmacy of record.

## 2021-05-17 NOTE — Telephone Encounter (Signed)
Called and spoke with pt regarding protein in urine.

## 2021-05-23 ENCOUNTER — Other Ambulatory Visit: Payer: Self-pay

## 2021-05-23 VITALS — BP 125/82 | HR 79 | Temp 98.8°F | Resp 16 | Ht 65.0 in | Wt 200.2 lb

## 2021-05-23 DIAGNOSIS — Z006 Encounter for examination for normal comparison and control in clinical research program: Secondary | ICD-10-CM

## 2021-05-23 NOTE — Progress Notes (Signed)
Opened in error, wrong patient chart

## 2021-05-23 NOTE — Research (Addendum)
     Screening Run-In Clinic Visit    Date of Visit: 05/23/2021   Subject #: [id number]      During this visit the following activities were completed:  $RemoveBef'[x]'RnBLAnvNWJ$ Reading, Signing and Understanding the informed Consent   '[x]'$ Review Inclusion/Exclusion Criteria  $RemoveB'[x]'mHEIqCdR$ Vital Signs, Height, & Weight:  - Blood pressure: 125/82  (Subject sat supine for at least 5 minutes before blood pressure was performed) - Heart rate:79 - Temperature:98.8 F / 37.1 C - Respiratory Rate:16 - Oxygen Saturation:97% - Weight:200.2 lbs / 90.81 kg  - Height:5'5"  $RemoveBefo'[x]'jRbbjeorSgO$ Physical Exam done by PI or Sub-I  $Remo'[x]'ukQsa$ Review Subjects Medical History & Concomitant Medications  $RemoveBefo'[x]'HFZxmveILCx$ Review Any Adverse Events/ Serious Adverse Events  $Remov'[x]'CjseTI$ Review of any ER Visits, Hospitalizations and Inpatient Days  $Rem'[x]'oryZ$ 12-Lead ECG (Subject sat supine for at least 5 minutes before this was performed)  *All ECG's completed will be available in subjects binder   '[x]'$  Subject fasting   '[x]'$  Blood and Urine specimens collected per protocol   '[]'$ Genetic Testing Completed (Only for patients with suspected FCS)  $Rem'[]'RVzZ$ Extended Urinalysis/Pregnancy Test (if woman of childbearing age)  $Rem'[x]'kFIl$ Diet/Lifestyle/Alcohol Counseling with Subject  $Remove'[x]'BQaCLBp$ FCS Symptoms 2 Week Recall  $Remov'[x]'blwsSR$ Education/teaching subject on importance of completing the daily diary   '[x]'$ Education on the importance of complying with contraception precautions during study with subject agreement   Subject came into the research clinic today ( December 8th, 2022) for the Screening Run-In Visit for the CORE research study.  Subject signed the informed consent under protocol amendment 2, Version 05 Sep 2020, before any assessments were completed.     Subject Name: Daniel Hayden   Subject met inclusion and exclusion criteria.  The informed consent form, study requirements and expectations were reviewed with the subject and questions and concerns were addressed prior to the signing of the consent form.  The  subject verbalized understanding of the trial requirements.  The subject agreed to participate in the CORE trial and signed the informed consent at 8:24am on 05/23/2021. The informed consent was obtained prior to performance of any protocol-specific procedures for the subject.  A copy of the signed informed consent was given to the subject and a copy was placed in the subject's medical record.  Burnadette Peter LPN   As the principal investigator of the CORE trial at [facility], I have reviewed the patients history, indications for the trial, labwork and other factors and find that they meet criteria for enrollment in the study. The patient was provided, reviewed and comprehended written consent to participate in the study and is agreeable to proceed.  General appearance: alert and no distress Neck: no carotid bruit, no JVD, and thyroid not enlarged, symmetric, no tenderness/mass/nodules Lungs: clear to auscultation bilaterally Heart: regular rate and rhythm, S1, S2 normal, no murmur, click, rub or gallop Abdomen: soft, non-tender; bowel sounds normal; no masses,  no organomegaly Extremities: extremities normal, atraumatic, no cyanosis or edema Pulses: 2+ and symmetric Skin: Skin color, texture, turgor normal. No rashes or lesions Neurologic: Grossly normal Psych: Pleasant   Pixie Casino, MD, FACC, [place] Director of the Advanced Lipid Disorders &  Cardiovascular Risk Reduction Clinic Diplomate of the American Board of Clinical Lipidology Attending Cardiologist  Direct Dial: [phone]  Fax: [phone]  Website:  [email or web address]

## 2021-05-23 NOTE — Progress Notes (Deleted)
           CORE Abnormal Lab report December 8th,2020  Chemistry: Glucose                         132mg /dL             [] Clinically Significant  [] Not Clinically Significant  Insulin                            45.8 -U/mL          [] Clinically Significant  [] Not Clinically Significant  Hematology: Eosinophil %                  10.2 %                  [] Clinically Significant  [] Not Clinically Significant  Urinalysis: Protein                             50 mg/dL            [] Clinically Significant  [] Not Clinically Significant Calcium Oxalate Crystals   present            [] Clinically Significant  [] Not Clinically Significant Squamous Epithelial Cells  Occ                 [] Clinically Significant  [] Not Clinically Significant Mucus                                  1+                   [] Clinically Significant  [] Not Clinically Significant  Urine Chemistry: Protein Creatinine Ratio       383 mg/g       [] Clinically Significant  [] Not Clinically Significant Urine Albumin                    32.80mg /dL      [] Clinically Significant  [] Not Clinically Significant Urine Protein                       45 mg/dL        [] Clinically Significant  [] Not Clinically Significant  Lipids: Triglyceride                        550 mg/dL        [] Clinically Significant  [] Not Clinically Significant  HDL Cholesterol (ppt)          25 mg/dL        [] Clinically Significant  [] Not Clinically Significant Apolipoprotein CIII             34.77 mg/dL     [] Clinically Significant  [] Not Clinically Significant Apolipoprotein E                   9.4 mg/dL      [] Clinically Significant  [] Not Clinically Significant   Any further action needed to be taken per the PI?

## 2021-05-27 ENCOUNTER — Encounter (INDEPENDENT_AMBULATORY_CARE_PROVIDER_SITE_OTHER): Payer: Self-pay | Admitting: Cardiovascular Disease

## 2021-05-28 NOTE — Progress Notes (Addendum)
° ° ° ° ° ° ° ° °  CORE Abnormal Lab report May 28, 2021  Chemistry: ALT/SGPT                 56 U/L      [] Clinically Significant  [x] Not Clinically Significant   Hematology:  RBC                       6.07 10^6/L     [] Clinically Significant  [x] Not Clinically Significant MCV                       80.3 fL            [] Clinically Significant  [x] Not Clinically Significant MCH                         26 pg            [] Clinically Significant  [x] Not Clinically Significant RDW                        16.3%           [] Clinically Significant  [x] Not Clinically Significant Neutrophil %            33.2%           [] Clinically Significant  [x] Not Clinically Significant Lymphocyte %         49.8%           [] Clinically Significant  [x] Not Clinically Significant Insulin                      56 U/mL        [] Clinically Significant  [x] Not Clinically Significant Hgb A1C                 6.5%              [] Clinically Significant  [x] Not Clinically Significant   Lipids:  Triglyceride         560 mg/dL         [] Clinically Significant  [x] Not Clinically Significant HDL-Cholesterol (ppt)  18 mg/dL  [] Clinically Significant  [x] Not Clinically Significant Apolipoprotein Al     98 mg/dL       [] Clinically Significant  [x] Not Clinically Significant Apolipoprotein E      9.5 mg/dL      [] Clinically Significant  [x] Not Clinically Significant    Any further action needed to be taken per the PI?  No  Pixie Casino, MD, [facility], [place] Director of the Advanced Lipid Disorders &  Cardiovascular Risk Reduction Clinic Diplomate of the American Board of Clinical Lipidology Attending Cardiologist  Direct Dial: [phone]   Fax: [phone]  Website:  [email or web address]

## 2021-06-05 ENCOUNTER — Encounter: Payer: Self-pay | Admitting: *Deleted

## 2021-06-05 NOTE — Progress Notes (Signed)
Appointment reminder sent in mychart for upcoming appointment at 0900 on Dec. 28.

## 2021-06-11 ENCOUNTER — Telehealth: Payer: Self-pay | Admitting: *Deleted

## 2021-06-11 NOTE — Telephone Encounter (Signed)
Called to remind Daniel Hayden of appointment tomorrow at 0900 and also informed not to eat or drink 10 hours before appointment. Voices understanding.

## 2021-06-12 ENCOUNTER — Other Ambulatory Visit: Payer: Self-pay

## 2021-06-12 ENCOUNTER — Encounter: Payer: Managed Care, Other (non HMO) | Admitting: *Deleted

## 2021-06-12 VITALS — BP 137/82 | HR 90 | Temp 99.5°F | Resp 18 | Ht 65.0 in | Wt 200.2 lb

## 2021-06-12 DIAGNOSIS — Z006 Encounter for examination for normal comparison and control in clinical research program: Secondary | ICD-10-CM

## 2021-06-12 NOTE — Research (Addendum)
° ° ° °  Screening Qualification Visit   Subject Number:  [id number]                     Date: 12 Jun 2021     [x] Inclusion/Exclusion Criteria   [] Pregnancy Test (if applicable)  [x] Collection of Hematology and Lipid Panel  [x] FCS Symptoms 7 Day Recall  [x] Assessment of ER Visits, Hospitalization and Inpatient Days  [x] Adverse Events and Concomitant Medications    Spoke with pt no ED, PCP, or urgent care visits since last here. No c/o Pain, states he feels good. Will call for next appointment after blood work reviewed.   Pixie Casino, MD, [facility], [place] Director of the Advanced Lipid Disorders &  Cardiovascular Risk Reduction Clinic Diplomate of the American Board of Clinical Lipidology Attending Cardiologist  Direct Dial: [phone]   Fax: [phone]  Website:  [email or web address]

## 2021-06-14 ENCOUNTER — Telehealth: Payer: Self-pay | Admitting: Internal Medicine

## 2021-06-14 NOTE — Telephone Encounter (Signed)
Called patient today and went over genetic test results.  All questions were answered to his satisfaction.  I still feel he will qualify for the core trial.  Screening is in progress.  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  We will need to assess that level at follow-up.  Pixie Casino, MD, [facility], [place] Director of the Advanced Lipid Disorders &  Cardiovascular Risk Reduction Clinic Diplomate of the American Board of Clinical Lipidology Attending Cardiologist  Direct Dial: [phone]   Fax: [phone]  Website:  [email or web address]

## 2021-06-19 ENCOUNTER — Telehealth: Payer: Self-pay | Admitting: *Deleted

## 2021-06-19 ENCOUNTER — Encounter: Payer: Self-pay | Admitting: *Deleted

## 2021-06-19 DIAGNOSIS — Z006 Encounter for examination for normal comparison and control in clinical research program: Secondary | ICD-10-CM

## 2021-06-19 NOTE — Telephone Encounter (Signed)
Received lab results for Core research. Triglycerides 272.  Not able to be retested per company protocol. Daniel Hayden called and informed of screen fail for study.

## 2021-06-19 NOTE — Research (Addendum)
° ° °  CORE Abnormal Lab report June 12, 2021   Hematology:             MCV  80.22fL                         [] Clinically Significant  [x] Not Clinically Significant  MCH 26  pg                         [] Clinically Significant  [x] Not Clinically Significant RDW 16.9%                          [] Clinically Significant  [x] Not Clinically Significant    Lipids:  Triglyceride 272 mg/dL                [] Clinically Significant  [x] Not Clinically Significant HDL-Cholesterol (ppt) 23 mg/dL  [] Clinically Significant  [x] Not Clinically Significant Apolipoprotein 121 mg/dL            [] Clinically Significant  [x] Not Clinically Significant Non HDL Cholesterol (Calc) 83 mg/dL[] Clinically Significant  [x] Not Clinically Significant Apolipopretein 66.2 mg/dL             [] Clinically Significant  [x] Not Clinically Significant   Any further action needed to be taken per the PI?  Triglycerides are much lower - will not be high enough to qualify.  Pixie Casino, MD, [facility], [place] Director of the Advanced Lipid Disorders &  Cardiovascular Risk Reduction Clinic Diplomate of the American Board of Clinical Lipidology Attending Cardiologist  Direct Dial: [phone]   Fax: [phone]  Website:  [email or web address]

## 2021-06-21 ENCOUNTER — Encounter: Payer: Self-pay | Admitting: *Deleted

## 2021-06-21 DIAGNOSIS — Z006 Encounter for examination for normal comparison and control in clinical research program: Secondary | ICD-10-CM

## 2021-06-21 NOTE — Research (Addendum)
° ° ° °  Remnant cholesterol, LDL-C and VLDL-C not clinically significant  Pixie Casino, MD, [facility], [place] Director of the Advanced Lipid Disorders &  Cardiovascular Risk Reduction Clinic Diplomate of the American Board of Clinical Lipidology Attending Cardiologist  Direct Dial: [phone]   Fax: [phone]  Website:  [email or web address]

## 2021-06-25 ENCOUNTER — Other Ambulatory Visit: Payer: Self-pay

## 2021-06-25 ENCOUNTER — Telehealth (INDEPENDENT_AMBULATORY_CARE_PROVIDER_SITE_OTHER): Payer: BC Managed Care – PPO | Admitting: Family Medicine

## 2021-06-25 ENCOUNTER — Encounter: Payer: Self-pay | Admitting: Family Medicine

## 2021-06-25 VITALS — Wt 195.0 lb

## 2021-06-25 DIAGNOSIS — U071 COVID-19: Secondary | ICD-10-CM

## 2021-06-25 MED ORDER — BENZONATATE 100 MG PO CAPS: ORAL_CAPSULE | ORAL | 0 refills | Status: AC

## 2021-06-25 NOTE — Progress Notes (Signed)
Virtual Visit via Video Note  I connected with Daniel Hayden  on 06/25/21 at  4:40 PM EST by a video enabled telemedicine application and verified that I am speaking with the correct person using two identifiers.  Location patient: [place] Location provider:work or home office Persons participating in the virtual visit: patient, provider  I discussed the limitations and requested verbal permission for telemedicine visit. The patient expressed understanding and agreed to proceed.   HPI:  Acute telemedicine visit for Covid19: -Onset: about 7-8 days ago; tested positive for covid a few days ago -wife and daughter had covid as well -Symptoms include: nasal congestion, sneezing, cough, diarrhea, some SOB when coughing -Denies:fever, CP, SOB, NVD -Pertinent past medical history: see below -Pertinent medication allergies: Allergies  Allergen Reactions   Cheese Rash    Cheese/dairy  -COVID-19 vaccine status:  Immunization History  Administered Date(s) Administered   Janssen (J&J) SARS-COV-2 Vaccination 08/30/2019, 04/11/2020     ROS: See pertinent positives and negatives per HPI.  Past Medical History:  Diagnosis Date   Diabetes mellitus without complication ([place])    Hyperlipidemia     Past Surgical History:  Procedure Laterality Date   HERNIA REPAIR       Current Outpatient Medications:    aspirin EC 81 MG tablet, Take 81 mg by mouth daily. Swallow whole., Disp: , Rfl:    atorvastatin (LIPITOR) 80 MG tablet, Take 80 mg by mouth daily., Disp: , Rfl:    benzonatate (TESSALON PERLES) 100 MG capsule, 1-2 capsules up to twice daily as needed for cough, Disp: 30 capsule, Rfl: 0   cetirizine (ZYRTEC) 10 MG tablet, Take 10 mg by mouth daily., Disp: , Rfl:    cyclobenzaprine (FLEXERIL) 10 MG tablet, Take 1 tablet (10 mg total) by mouth at bedtime., Disp: 30 tablet, Rfl: 1   Icosapent Ethyl (VASCEPA PO), Take 2 capsules by mouth in the morning and at bedtime., Disp: , Rfl:    icosapent Ethyl  (VASCEPA) 1 g capsule, Take 2 g by mouth 2 (two) times daily., Disp: , Rfl:    triamcinolone cream (KENALOG) 0.1 %, Apply 1 application topically 2 (two) times daily. (Patient taking differently: Apply 1 application topically as needed.), Disp: 30 g, Rfl: 1  EXAM:  VITALS per patient if applicable:  GENERAL: alert, oriented, appears well and in no acute distress  HEENT: atraumatic, conjunttiva clear, no obvious abnormalities on inspection of external nose and ears  NECK: normal movements of the head and neck  LUNGS: on inspection no signs of respiratory distress, breathing rate appears normal, no obvious Hayden SOB, gasping or wheezing  CV: no obvious cyanosis  MS: moves all visible extremities without noticeable abnormality  PSYCH/NEURO: pleasant and cooperative, no obvious depression or anxiety, speech and thought processing grossly intact  ASSESSMENT AND PLAN:  Discussed the following assessment and plan:  COVID-19  -we discussed possible serious and likely etiologies, options for evaluation and workup, limitations of telemedicine visit vs in person visit, treatment, treatment risks and precautions. Pt is agreeable to treatment via telemedicine at this moment. Out of treatment window for antiviral and feels symptoms are pretty mild. Opted for symptomatic care with nasal saline and Cough rx. Advised of isolation, typical contagious period, precuations.  Advised to seek prompt virtual visit or in person care if worsening, new symptoms arise, or if is not improving with treatment as expected per our conversation of expected course. Discussed options for follow up care. Did let this patient know that I do telemedicine  on Tuesdays and Thursdays for [place] and those are the days I am logged into the system. Advised to schedule follow up visit with PCP,  virtual visits or UCC if any further questions or concerns to avoid delays in care.   I discussed the assessment and treatment  plan with the patient. The patient was provided an opportunity to ask questions and all were answered. The patient agreed with the plan and demonstrated an understanding of the instructions.     Lucretia Kern, DO

## 2021-06-25 NOTE — Patient Instructions (Addendum)
°  HOME CARE TIPS:  -I sent the medication(s) we discussed to your pharmacy: Meds ordered this encounter  Medications   benzonatate (TESSALON PERLES) 100 MG capsule    Sig: 1-2 capsules up to twice daily as needed for cough    Dispense:  30 capsule    Refill:  0     -can use nasal saline a few times per day if you have nasal congestion  -stay hydrated, drink plenty of fluids and eat small healthy meals - avoid dairy   -follow up with your doctor in 2-3 days unless improving and feeling better  -stay home while sick, except to seek medical care. If you have COVID19, you will likely be contagious for 7-10 days. Flu or Influenza is likely contagious for about 7 days. Other respiratory viral infections remain contagious for 5-10+ days depending on the virus and many other factors. Wear a good mask that fits snugly (such as N95 or KN95) if around others to reduce the risk of transmission.  It was nice to meet you today, and I really hope you are feeling better soon. I help [place] out with telemedicine visits on Tuesdays and Thursdays and am happy to help if you need a follow up virtual visit on those days. Otherwise, if you have any concerns or questions following this visit please schedule a follow up visit with your Primary Care doctor or seek care at a local urgent care clinic to avoid delays in care.    Seek in person care or schedule a follow up video visit promptly if your symptoms worsen, new concerns arise or you are not improving with treatment. Call 911 and/or seek emergency care if your symptoms are severe or life threatening.

## 2021-07-01 ENCOUNTER — Institutional Professional Consult (permissible substitution): Payer: Managed Care, Other (non HMO) | Admitting: Internal Medicine

## 2021-07-03 ENCOUNTER — Other Ambulatory Visit (INDEPENDENT_AMBULATORY_CARE_PROVIDER_SITE_OTHER): Payer: Self-pay | Admitting: Internal Medicine

## 2021-07-03 ENCOUNTER — Encounter (HOSPITAL_BASED_OUTPATIENT_CLINIC_OR_DEPARTMENT_OTHER): Payer: Self-pay

## 2021-07-03 DIAGNOSIS — E785 Hyperlipidemia, unspecified: Secondary | ICD-10-CM

## 2021-07-04 ENCOUNTER — Encounter: Payer: Self-pay | Admitting: Internal Medicine

## 2021-07-04 ENCOUNTER — Ambulatory Visit: Payer: BC Managed Care – PPO | Admitting: Internal Medicine

## 2021-07-04 ENCOUNTER — Other Ambulatory Visit: Payer: Self-pay

## 2021-07-04 DIAGNOSIS — U071 COVID-19: Secondary | ICD-10-CM | POA: Diagnosis not present

## 2021-07-04 MED ORDER — LIDOCAINE VISCOUS HCL 2 % MT SOLN: 15.0000 mL | Freq: Four times a day (QID) | OROMUCOSAL | 0 refills | Status: AC | PRN

## 2021-07-04 MED ORDER — FLUTICASONE PROPIONATE 50 MCG/ACT NA SUSP: 2.0000 | Freq: Every day | NASAL | 6 refills | Status: AC

## 2021-07-04 NOTE — Patient Instructions (Signed)
Keep taking the flonase for the sinuses.   We will send in lidocaine liquid to take 10 mL and swish and swallow.

## 2021-07-04 NOTE — Progress Notes (Signed)
° °  Subjective:   Patient ID: Daniel Hayden, male    DOB: 08/30/1965, 56 y.o.   MRN: [id number]  HPI The patient is a 56 YO man coming in for lingering covid-19 symptoms. Positive 06/23/21 and started almost a week prior to that.   Review of Systems  Constitutional: Negative.   HENT:  Positive for congestion and sore throat.   Eyes: Negative.   Respiratory:  Positive for cough. Negative for chest tightness and shortness of breath.   Cardiovascular:  Negative for chest pain, palpitations and leg swelling.  Gastrointestinal:  Negative for abdominal distention, abdominal pain, constipation, diarrhea, nausea and vomiting.  Musculoskeletal: Negative.   Skin: Negative.   Neurological: Negative.   Psychiatric/Behavioral: Negative.     Objective:  Physical Exam Constitutional:      Appearance: He is well-developed.  HENT:     Head: Normocephalic and atraumatic.     Comments: Oropharynx with redness and clear drainage, nose with swollen turbinates, TMs normal bilaterally.  Neck:     Thyroid: No thyromegaly.  Cardiovascular:     Rate and Rhythm: Normal rate and regular rhythm.  Pulmonary:     Effort: Pulmonary effort is normal. No respiratory distress.     Breath sounds: Normal breath sounds. No wheezing or rales.  Abdominal:     Palpations: Abdomen is soft.  Musculoskeletal:        General: Tenderness present.     Cervical back: Normal range of motion.  Lymphadenopathy:     Cervical: No cervical adenopathy.  Skin:    General: Skin is warm and dry.  Neurological:     Mental Status: He is alert and oriented to person, place, and time.    Vitals:   07/04/21 1053  BP: 122/80  Pulse: 67  Resp: 18  SpO2: 96%  Weight: 198 lb 6.4 oz (90 kg)  Height: 5\' 5"  (1.651 m)    This visit occurred during the SARS-CoV-2 public health emergency.  Safety protocols were in place, including screening questions prior to the visit, additional usage of staff PPE, and extensive cleaning of exam room  while observing appropriate contact time as indicated for disinfecting solutions.   Assessment & Plan:

## 2021-07-05 DIAGNOSIS — U071 COVID-19: Secondary | ICD-10-CM | POA: Insufficient documentation

## 2021-07-05 NOTE — Assessment & Plan Note (Signed)
Rx lidocaine viscous for sore throat. Rx flonase for drainage and encouraged to use regularly for 1-2 weeks.

## 2021-07-08 NOTE — Progress Notes (Signed)
07/09/21- 56 yoM former smoker for sleep evaluation courtesy of Dr Mitchel Honour with concern of OSA on CPAP Medical problem list includes OSA/ uppp, DM2, Hyperlipidemia, Covid infection early Jan, 2023, Psoriasis,  Epworth score-14 Body weight today- Covid vax-2 J&J Flu vax-declies Current machine is aN Air Sense 10, more than 56 years old. -----Patient wears CPAP its from [place]. Patient states that he sleeps very well. Needs to be established with local DME. Daniel Hayden comes with Daniel Hayden who helps with history.  He has had CPAP more than 5 years.  They think original testing might have been done in [place] through a [place] program but they do not have more specifics.  He does not want to sleep without CPAP because he sleeps better at it prevents snoring. ENT surgery positive for tonsillectomy and uvulopalatopharyngoplasty He feels completely recovered from COVID infection earlier this year, treated at home without antivirals.  Prior to Admission medications   Medication Sig Start Date End Date Taking? Authorizing Provider  aspirin EC 81 MG tablet Take 81 mg by mouth daily. Swallow whole.    [provider]  atorvastatin (LIPITOR) 80 MG tablet Take 80 mg by mouth daily.    [provider]  benzonatate (TESSALON PERLES) 100 MG capsule 1-2 capsules up to twice daily as needed for cough 06/25/21   Lucretia Kern, DO  cetirizine (ZYRTEC) 10 MG tablet Take 10 mg by mouth daily.    [provider]  cyclobenzaprine (FLEXERIL) 10 MG tablet Take 1 tablet (10 mg total) by mouth at bedtime. 01/31/21   Horald Pollen, MD  fluticasone [facility]) 50 MCG/ACT nasal spray Place 2 sprays into both nostrils daily. 07/04/21   Hoyt Koch, MD  Icosapent Ethyl (VASCEPA PO) Take 2 capsules by mouth in the morning and at bedtime.    [provider]  icosapent Ethyl (VASCEPA) 1 g capsule Take 2 g by mouth 2 (two) times daily. 05/12/21   [provider]   lidocaine (XYLOCAINE) 2 % solution Use as directed 15 mLs in the mouth or throat every 6 (six) hours as needed for mouth pain. 07/04/21   Hoyt Koch, MD  triamcinolone cream (KENALOG) 0.1 % Apply 1 application topically 2 (two) times daily. Patient taking differently: Apply 1 application topically as needed. 04/24/21   Horald Pollen, MD   Past Medical History:  Diagnosis Date   Diabetes mellitus without complication ([place])    Hyperlipidemia    Past Surgical History:  Procedure Laterality Date   HERNIA REPAIR     Family History  Problem Relation Age of Onset   Pancreatic cancer Sister    Leukemia Sister    Other Brother    Other Son    Asthma Daughter    Social History   Socioeconomic History   Marital status: Married    Spouse name: Not on file   Number of children: Not on file   Years of education: Not on file   Highest education level: Not on file  Occupational History   Not on file  Tobacco Use   Smoking status: Former    Packs/day: 0.50    Years: 15.00    Pack years: 7.50    Types: Cigarettes    Quit date: 1997    Years since quitting: 26.0   Smokeless tobacco: Never  Vaping Use   Vaping Use: Never used  Substance and Sexual Activity   Alcohol use: Not on file    Comment: occasional  Drug use: Not Currently   Sexual activity: Not on file  Other Topics Concern   Not on file  Social History Narrative   Not on file   Social Determinants of Health   Financial Resource Strain: Not on file  Food Insecurity: Not on file  Transportation Needs: Not on file  Physical Activity: Not on file  Stress: Not on file  Social Connections: Not on file  Intimate Partner Violence: Not on file   ROS-see HPI    Constitutional:    weight loss, night sweats, fevers, chills, fatigue, lassitude. HEENT:    headaches, difficulty swallowing, tooth/dental problems, +sore throat,       +sneezing, itching, ear ache, +nasal congestion, post nasal drip,  snoring CV:    chest pain, orthopnea, PND, swelling in lower extremities, anasarca,                                   dizziness, palpitations Resp:   +shortness of breath with exertion or at rest.                +productive cough,   non-productive cough, coughing up of blood.              change in color of mucus.  wheezing.   Skin:    +psoriasis GI:  No-   heartburn, indigestion, abdominal pain, nausea, vomiting, diarrhea,                 change in bowel habits, +loss of appetite GU: dysuria, change in color of urine, no urgency or frequency.   flank pain. MS:   joint pain, stiffness, decreased range of motion, back pain. Neuro-     nothing unusual Psych:  change in mood or affect.  depression or anxiety.   memory loss.  OBJ- Physical Exam General- Alert, Oriented, Affect-appropriate, Distress- none acute Skin- rash-none, lesions- none, excoriation- none Lymphadenopathy- none Head- atraumatic            Eyes- Gross vision intact, PERRLA, conjunctivae and secretions clear            Ears- Hearing, canals-normal            Nose- Clear, no-Septal dev, mucus, polyps, erosion, perforation             Throat- Mallampati IV , mucosa clear , drainage- none, tonsils- absent, + teeth Neck- flexible , trachea midline, no stridor , thyroid nl, carotid no bruit Chest - symmetrical excursion , unlabored           Heart/CV- RRR , no murmur , no gallop  , no rub, nl s1 s2                           - JVD- none , edema- none, stasis changes- none, varices- none           Lung- clear to P&A, wheeze- none, cough- none , dullness-none, rub- none           Chest wall-  Abd-  Br/ Gen/ Rectal- Not done, not indicated Extrem- cyanosis- none, clubbing, none, atrophy- none, strength- nl Neuro- grossly intact to observation

## 2021-07-09 ENCOUNTER — Encounter (HOSPITAL_BASED_OUTPATIENT_CLINIC_OR_DEPARTMENT_OTHER): Payer: Self-pay

## 2021-07-09 ENCOUNTER — Ambulatory Visit (INDEPENDENT_AMBULATORY_CARE_PROVIDER_SITE_OTHER): Payer: BC Managed Care – PPO | Admitting: Internal Medicine

## 2021-07-09 ENCOUNTER — Other Ambulatory Visit: Payer: Self-pay

## 2021-07-09 ENCOUNTER — Encounter: Payer: Self-pay | Admitting: Internal Medicine

## 2021-07-09 VITALS — BP 130/88 | HR 85 | Temp 98.2°F | Ht 65.0 in | Wt 201.4 lb

## 2021-07-09 DIAGNOSIS — Z9989 Dependence on other enabling machines and devices: Secondary | ICD-10-CM

## 2021-07-09 DIAGNOSIS — G4733 Obstructive sleep apnea (adult) (pediatric): Secondary | ICD-10-CM

## 2021-07-09 DIAGNOSIS — U071 COVID-19: Secondary | ICD-10-CM | POA: Diagnosis not present

## 2021-07-09 NOTE — Patient Instructions (Signed)
Order- schedule home sleep test    dx OSA  When we have these results, we should be able to get you set up with a new DME company and a replacement for your old CPAP machine.

## 2021-07-12 ENCOUNTER — Encounter: Payer: Self-pay | Admitting: Internal Medicine

## 2021-07-12 NOTE — Assessment & Plan Note (Signed)
Known OSA which has been managed very effectively by his description with CPAP.  We have no ST card and do not know current settings.  Machine is more than 56 years old.  He and wife will try to get original diagnostic NPSG report.  Lacking that we will go ahead and get a home sleep test off CPAP anticipating that we will requalify him so we can get him a local DME company and a new CPAP machine.

## 2021-07-12 NOTE — Assessment & Plan Note (Signed)
He reports symptom onset around January 3, testing + June 23, 2021.  When I spoke to him he denied residual symptoms but on review of systems indicated some residual productive cough that he considered left over from this infection.  Chest exam was clear. Plan-observation

## 2021-07-16 ENCOUNTER — Other Ambulatory Visit: Payer: Self-pay

## 2021-07-17 DIAGNOSIS — R809 Proteinuria, unspecified: Secondary | ICD-10-CM | POA: Diagnosis not present

## 2021-07-17 DIAGNOSIS — E1122 Type 2 diabetes mellitus with diabetic chronic kidney disease: Secondary | ICD-10-CM | POA: Diagnosis not present

## 2021-07-17 DIAGNOSIS — E785 Hyperlipidemia, unspecified: Secondary | ICD-10-CM | POA: Diagnosis not present

## 2021-07-17 DIAGNOSIS — R319 Hematuria, unspecified: Secondary | ICD-10-CM | POA: Diagnosis not present

## 2021-07-17 DIAGNOSIS — I129 Hypertensive chronic kidney disease with stage 1 through stage 4 chronic kidney disease, or unspecified chronic kidney disease: Secondary | ICD-10-CM | POA: Diagnosis not present

## 2021-07-19 ENCOUNTER — Other Ambulatory Visit: Payer: Self-pay | Admitting: Nephrology

## 2021-07-19 DIAGNOSIS — I129 Hypertensive chronic kidney disease with stage 1 through stage 4 chronic kidney disease, or unspecified chronic kidney disease: Secondary | ICD-10-CM

## 2021-07-30 ENCOUNTER — Telehealth: Payer: Self-pay | Admitting: Physician Assistant

## 2021-07-30 NOTE — Telephone Encounter (Signed)
Scheduled appt per 2/14 referral. Spoke to pt who is aware of appt date and time. Pt is aware to arrive 15 mins prior to appt time.  °

## 2021-08-02 ENCOUNTER — Other Ambulatory Visit: Payer: BC Managed Care – PPO

## 2021-08-07 ENCOUNTER — Other Ambulatory Visit (INDEPENDENT_AMBULATORY_CARE_PROVIDER_SITE_OTHER): Payer: Self-pay | Admitting: Cardiovascular Disease

## 2021-08-07 ENCOUNTER — Ambulatory Visit
Admission: RE | Admit: 2021-08-07 | Discharge: 2021-08-07 | Disposition: A | Discharge status: Home / Self Care | Payer: BC Managed Care – PPO | Source: Ambulatory Visit | Attending: Nephrology | Admitting: Nephrology

## 2021-08-07 DIAGNOSIS — I129 Hypertensive chronic kidney disease with stage 1 through stage 4 chronic kidney disease, or unspecified chronic kidney disease: Secondary | ICD-10-CM

## 2021-08-15 ENCOUNTER — Encounter: Payer: Self-pay | Admitting: Internal Medicine

## 2021-08-16 ENCOUNTER — Telehealth: Payer: Self-pay | Admitting: Internal Medicine

## 2021-08-16 MED ORDER — ICOSAPENT ETHYL 1 G PO CAPS: 2.0000 g | ORAL_CAPSULE | Freq: Two times a day (BID) | ORAL | 2 refills | Status: AC

## 2021-08-16 NOTE — Telephone Encounter (Signed)
PA for vascepa submitted via CMM ?(Key: [id number]) ?

## 2021-08-18 ENCOUNTER — Encounter: Payer: Self-pay | Admitting: Emergency Medicine

## 2021-08-19 ENCOUNTER — Other Ambulatory Visit: Payer: Self-pay | Admitting: Emergency Medicine

## 2021-08-19 DIAGNOSIS — E1165 Type 2 diabetes mellitus with hyperglycemia: Secondary | ICD-10-CM

## 2021-08-19 MED ORDER — OZEMPIC (0.25 OR 0.5 MG/DOSE) 2 MG/1.5ML ~~LOC~~ SOPN: 0.5000 mg | PEN_INJECTOR | SUBCUTANEOUS | 5 refills | Status: AC

## 2021-08-19 NOTE — Telephone Encounter (Signed)
Medication approved ?Message from plan: Effective from 08/16/2021 through 08/15/2022 ?

## 2021-08-19 NOTE — Telephone Encounter (Signed)
Not a problem and I think it is better for him.  Stop metformin.  Start weekly Ozempic.  New prescription sent to pharmacy of record.  However there is a Psychologist, educational and he may not be able to get it.  Also I am not sure it will be covered by his insurance.  Depends on formulary.  Thanks.

## 2021-08-19 NOTE — Progress Notes (Signed)
[facility] [phone]   [phone]  INITIAL CONSULT NOTE  Patient Care Team: Georgina Quint, MD as PCP - General (Internal Medicine)  Hematological/Oncological History 1)Labs from Dr. Bufford Buttner from [facility] -07/17/2021: SPEP showed monoclonal protein measuring 0.5 g/dL.  Immunofixation shows IgM monoclonal protein with kappa light chain specificity.Free kappa light chain was elevated at 17.7 mg/L, free lambda light chain measured 8.1 mg/L and free light chain ratio was 2.19. Hemogloin 15.7, Plt 191K, Creatinine 1.10 mg/dL, Calcium 11.9 mg/dL.   2) 08/21/2021: Establish care with [facility] Hematology/Oncology  CHIEF COMPLAINTS/PURPOSE OF CONSULTATION:  "Monoclonal gammopathy "  HISTORY OF PRESENTING ILLNESS:  Daniel Hayden 56 y.o. male with medical history significant for prediabetes and hyperlipidemia.  Patient is accompanied by his wife for this visit.  On exam today, Mr. Cecere reports that his energy levels are stable.  He tries to stay active and complete all his daily activities on his own.  He exercises regularly and maintains a balanced, healthy diet.  He denies any nausea, vomiting or abdominal pain.  He reports some constipation with a bowel movement every 1 to 2 days.  He denies easy bruising or signs of active bleeding.  Patient has chronic neck pain secondary to arthritis.  Otherwise he has no other back pain, hip pain or bone pain.  Patient denies fevers, chills, night sweats, shortness of breath, chest pain or cough.  He has no other complaints.  Rest of the 10 point ROS is below. MEDICAL HISTORY:  Past Medical History:  Diagnosis Date   Diabetes mellitus without complication (HCC)    Hyperlipidemia    Psoriasis     SURGICAL HISTORY: Past Surgical History:  Procedure Laterality Date   HERNIA REPAIR      SOCIAL HISTORY: Social History   Socioeconomic History   Marital status: Married    Spouse name: Not on  file   Number of children: Not on file   Years of education: Not on file   Highest education level: Not on file  Occupational History   Not on file  Tobacco Use   Smoking status: Former    Packs/day: 0.50    Years: 15.00    Pack years: 7.50    Types: Cigarettes    Quit date: 50    Years since quitting: 26.1   Smokeless tobacco: Never  Vaping Use   Vaping Use: Never used  Substance and Sexual Activity   Alcohol use: Yes    Comment: occasional   Drug use: Not Currently   Sexual activity: Not on file  Other Topics Concern   Not on file  Social History Narrative   Not on file   Social Determinants of Health   Financial Resource Strain: Not on file  Food Insecurity: Not on file  Transportation Needs: Not on file  Physical Activity: Not on file  Stress: Not on file  Social Connections: Not on file  Intimate Partner Violence: Not on file    FAMILY HISTORY: Family History  Problem Relation Age of Onset   Pancreatic cancer Sister    Leukemia Sister    Other Brother    Other Son    Asthma Daughter     ALLERGIES:  is allergic to cheese.  MEDICATIONS:  Current Outpatient Medications  Medication Sig Dispense Refill   aspirin EC 81 MG tablet Take 81 mg by mouth daily. Swallow whole.     atorvastatin (LIPITOR) 80 MG tablet Take 80 mg by mouth  daily.     benzonatate (TESSALON PERLES) 100 MG capsule 1-2 capsules up to twice daily as needed for cough 30 capsule 0   cetirizine (ZYRTEC) 10 MG tablet Take 10 mg by mouth daily.     cyclobenzaprine (FLEXERIL) 10 MG tablet Take 1 tablet (10 mg total) by mouth at bedtime. 30 tablet 1   fluticasone (FLONASE) 50 MCG/ACT nasal spray Place 2 sprays into both nostrils daily. 16 g 6   icosapent Ethyl (VASCEPA) 1 g capsule Take 2 capsules (2 g total) by mouth in the morning and at bedtime. 360 capsule 2   lidocaine (XYLOCAINE) 2 % solution Use as directed 15 mLs in the mouth or throat every 6 (six) hours as needed for mouth pain. 100 mL  0   Semaglutide,0.25 or 0.5MG /DOS, (OZEMPIC, 0.25 OR 0.5 MG/DOSE,) 2 MG/1.5ML SOPN Inject 0.5 mg into the skin once a week. 1.5 mL 5   triamcinolone cream (KENALOG) 0.1 % Apply 1 application topically 2 (two) times daily. (Patient taking differently: Apply 1 application. topically as needed.) 30 g 1   No current facility-administered medications for this visit.    REVIEW OF SYSTEMS:   Constitutional: ( - ) fevers, ( - )  chills , ( - ) night sweats Eyes: ( - ) blurriness of vision, ( - ) double vision, ( - ) watery eyes Ears, nose, mouth, throat, and face: ( - ) mucositis, ( - ) sore throat Respiratory: ( - ) cough, ( - ) dyspnea, ( - ) wheezes Cardiovascular: ( - ) palpitation, ( - ) chest discomfort, ( - ) lower extremity swelling Gastrointestinal:  ( - ) nausea, ( - ) heartburn, ( - ) change in bowel habits Skin: ( - ) abnormal skin rashes Lymphatics: ( - ) new lymphadenopathy, ( - ) easy bruising Neurological: ( - ) numbness, ( - ) tingling, ( - ) new weaknesses Behavioral/Psych: ( - ) mood change, ( - ) new changes  All other systems were reviewed with the patient and are negative.  PHYSICAL EXAMINATION: ECOG PERFORMANCE STATUS: 0 - Asymptomatic  Vitals:   08/21/21 0941  BP: 118/79  Pulse: 62  Resp: 18  Temp: (!) 97.2 F (36.2 C)  SpO2: 100%   Filed Weights   08/21/21 0941  Weight: 196 lb 4 oz (89 kg)    GENERAL: well appearing male in NAD  SKIN: skin color, texture, turgor are normal, no rashes or significant lesions EYES: conjunctiva are pink and non-injected, sclera clear OROPHARYNX: no exudate, no erythema; lips, buccal mucosa, and tongue normal  NECK: supple, non-tender LYMPH:  no palpable lymphadenopathy in the cervical or supraclavicular lymph nodes.  LUNGS: clear to auscultation and percussion with normal breathing effort HEART: regular rate & rhythm and no murmurs and no lower extremity edema ABDOMEN: soft, non-tender, non-distended, normal bowel  sounds Musculoskeletal: no cyanosis of digits and no clubbing  PSYCH: alert & oriented x 3, fluent speech NEURO: no focal motor/sensory deficits  LABORATORY DATA:  I have reviewed the data as listed CBC Latest Ref Rng & Units 08/21/2021 05/13/2021  WBC 4.0 - 10.5 K/uL 5.3 4.9  Hemoglobin 13.0 - 17.0 g/dL 32.9 51.8  Hematocrit 84.1 - 52.0 % 50.7 49.9  Platelets 150 - 400 K/uL 197 159    CMP Latest Ref Rng & Units 08/21/2021 05/13/2021 02/01/2021  Glucose 70 - 99 mg/dL 660(Y) 301(S) 010(X)  BUN 6 - 20 mg/dL 16 12 14   Creatinine 0.61 - 1.24 mg/dL 3.23 5.57  1.13  Sodium 135 - 145 mmol/L 140 136 137  Potassium 3.5 - 5.1 mmol/L 4.3 4.0 4.0  Chloride 98 - 111 mmol/L 103 102 100  CO2 22 - 32 mmol/L 31 25 28   Calcium 8.9 - 10.3 mg/dL 16.1 9.6 9.9  Total Protein 6.5 - 8.1 g/dL 7.9 - 7.4  Total Bilirubin 0.3 - 1.2 mg/dL 0.9 - 0.7  Alkaline Phos 38 - 126 U/L 61 - 61  AST 15 - 41 U/L 26 - 31  ALT 0 - 44 U/L 45(H) - 48    RADIOGRAPHIC STUDIES: I have personally reviewed the radiological images as listed and agreed with the findings in the report. US RENAL  Result Date: 08/08/2021 CLINICAL DATA:  Hypertension, renal dysfunction EXAM: RENAL / URINARY TRACT ULTRASOUND COMPLETE COMPARISON:  None. FINDINGS: Right Kidney: Renal measurements: 11.2 x 5.9 x 5.9 cm = volume: 201 mL. There is no hydronephrosis. There is 6 mm right renal stone in the lower pole. There is no focal cortical thinning. Left Kidney: Renal measurements: 12 x 5.3 x 4.8 cm = volume: 159 mL. Echogenicity within normal limits. No mass or hydronephrosis visualized. Bladder: Appears normal for degree of bladder distention. Other: Prostate measures 4.6 x 4.1 x 4.7 cm. IMPRESSION: There is no hydronephrosis. There is 6 mm nonobstructing right renal stone. Electronically Signed   By: Ernie Avena M.D.   On: 08/08/2021 14:15    ASSESSMENT & PLAN Zakarias Shirazi is a 56 y.o. male who presents to the clinic for evaluation for monoclonal  gammopathy. Based on outside labs, there is evidence of monoclonal protein measuring 0.5 g/dL.  Immunofixation showed IgM monoclonal protein with kappa light chain specificity.Free kappa light chain was elevated at 17.7 mg/L, free lambda light chain measured 8.1 mg/L and free light chain ratio was 2.19.   The recommendation is to undergo a complete workup with CBC, CMP, multiple myeloma panel, kappa/lambda light chain, UPEP, beta 2 microglobulin and LDH levels. Bone Survey will be obtained to evaluate for lytic lesions.   #Monoclonal gammopathy, IgM with kappa light chain specificity: --Labs today to check CBC, CMP, multiple myeloma panel, kappa/lambda light chain, UPEP,beta 2 microglobulin and LDH levels.  --Need to obtain DG bone met surgery to evaluate for lytic lesions.  --Will determine need for bone marrow biopsy with above workup --RTC once workup is complete.   Orders Placed This Encounter  Procedures   DG Bone Survey Met    Standing Status:   Future    Standing Expiration Date:   08/20/2022    Order Specific Question:   Reason for Exam (SYMPTOM  OR DIAGNOSIS REQUIRED)    Answer:   evaluate for lytic lesions    Order Specific Question:   Preferred imaging location?    Answer:   [facility]   CBC with Differential (Cancer Center Only)    Standing Status:   Future    Number of Occurrences:   1    Standing Expiration Date:   08/20/2022   CMP (Cancer Center only)    Standing Status:   Future    Number of Occurrences:   1    Standing Expiration Date:   08/20/2022   Lactate dehydrogenase (LDH)    Standing Status:   Future    Number of Occurrences:   1    Standing Expiration Date:   08/19/2022   Multiple Myeloma Panel (SPEP&IFE w/QIG)    Standing Status:   Future    Number of Occurrences:  1    Standing Expiration Date:   08/19/2022   Kappa/lambda light chains    Standing Status:   Future    Number of Occurrences:   1    Standing Expiration Date:   08/19/2022   24-Hr Ur  UPEP/UIFE/Light Chains/TP    Standing Status:   Future    Standing Expiration Date:   08/19/2022   Beta 2 microglobulin    Standing Status:   Future    Number of Occurrences:   1    Standing Expiration Date:   08/19/2022    All questions were answered. The patient knows to call the clinic with any problems, questions or concerns.  I have spent a total of 60 minutes minutes of face-to-face and non-face-to-face time, preparing to see the patient, obtaining and/or reviewing separately obtained history, performing a medically appropriate examination, counseling and educating the patient, ordering tests/procedures, documenting clinical information in the electronic health record and care coordination.   Georga Kaufmann, PA-C Department of Hematology/Oncology [facility] Phone: [phone]  Patient was seen with Dr. Leonides Schanz  I have read the above note and personally examined the patient. I agree with the assessment and plan as noted above.  Briefly Mr. Dail Balius is a 56 year old male who presents for evaluation of a monoclonal gammopathy of undetermined significance.  He is noted to have IgM specificity as well as an elevated serum free light chain ratio.  Given these findings we will need to pursue a bone marrow biopsy if these test results are duplicated with a repeat testing today.  Additionally the patient will require a UPEP and metastatic survey to assess for bone lesions.  We will plan to see the patient back once the above testing has been completed.  The patient and his wife voiced understanding of the plan moving forward.   Ulysees Barns, MD Department of Hematology/Oncology [facility] Phone: [phone] Pager: [phone] Email: [email or web address]

## 2021-08-21 ENCOUNTER — Inpatient Hospital Stay: Payer: BC Managed Care – PPO | Attending: Physician Assistant | Admitting: Physician Assistant

## 2021-08-21 ENCOUNTER — Encounter: Payer: Self-pay | Admitting: Physician Assistant

## 2021-08-21 ENCOUNTER — Inpatient Hospital Stay: Payer: BC Managed Care – PPO

## 2021-08-21 ENCOUNTER — Other Ambulatory Visit: Payer: Self-pay

## 2021-08-21 VITALS — BP 118/79 | HR 62 | Temp 97.2°F | Resp 18 | Wt 196.2 lb

## 2021-08-21 DIAGNOSIS — D472 Monoclonal gammopathy: Secondary | ICD-10-CM | POA: Insufficient documentation

## 2021-08-21 DIAGNOSIS — Z806 Family history of leukemia: Secondary | ICD-10-CM | POA: Diagnosis not present

## 2021-08-21 DIAGNOSIS — E785 Hyperlipidemia, unspecified: Secondary | ICD-10-CM | POA: Diagnosis not present

## 2021-08-21 DIAGNOSIS — Z808 Family history of malignant neoplasm of other organs or systems: Secondary | ICD-10-CM | POA: Diagnosis not present

## 2021-08-21 DIAGNOSIS — Z87891 Personal history of nicotine dependence: Secondary | ICD-10-CM | POA: Diagnosis not present

## 2021-08-21 DIAGNOSIS — E119 Type 2 diabetes mellitus without complications: Secondary | ICD-10-CM | POA: Diagnosis not present

## 2021-08-21 LAB — CBC WITH DIFFERENTIAL (CANCER CENTER ONLY)
Abs Immature Granulocytes: 0.01 10*3/uL (ref 0.00–0.07)
Basophils Absolute: 0 10*3/uL (ref 0.0–0.1)
Basophils Relative: 0 %
Eosinophils Absolute: 0.3 10*3/uL (ref 0.0–0.5)
Eosinophils Relative: 5 %
HCT: 50.7 % (ref 39.0–52.0)
Hemoglobin: 15.5 g/dL (ref 13.0–17.0)
Immature Granulocytes: 0 %
Lymphocytes Relative: 28 %
Lymphs Abs: 1.5 10*3/uL (ref 0.7–4.0)
MCH: 25.3 pg — ABNORMAL LOW (ref 26.0–34.0)
MCHC: 30.6 g/dL (ref 30.0–36.0)
MCV: 82.7 fL (ref 80.0–100.0)
Monocytes Absolute: 0.4 10*3/uL (ref 0.1–1.0)
Monocytes Relative: 8 %
Neutro Abs: 3.1 10*3/uL (ref 1.7–7.7)
Neutrophils Relative %: 59 %
Platelet Count: 197 10*3/uL (ref 150–400)
RBC: 6.13 MIL/uL — ABNORMAL HIGH (ref 4.22–5.81)
RDW: 17.5 % — ABNORMAL HIGH (ref 11.5–15.5)
WBC Count: 5.3 10*3/uL (ref 4.0–10.5)
nRBC: 0 % (ref 0.0–0.2)

## 2021-08-21 LAB — CMP (CANCER CENTER ONLY)
ALT: 45 U/L — ABNORMAL HIGH (ref 0–44)
AST: 26 U/L (ref 15–41)
Albumin: 4.9 g/dL (ref 3.5–5.0)
Alkaline Phosphatase: 61 U/L (ref 38–126)
Anion gap: 6 (ref 5–15)
BUN: 16 mg/dL (ref 6–20)
CO2: 31 mmol/L (ref 22–32)
Calcium: 10.2 mg/dL (ref 8.9–10.3)
Chloride: 103 mmol/L (ref 98–111)
Creatinine: 1 mg/dL (ref 0.61–1.24)
GFR, Estimated: >60 mL/min (ref >60)
Glucose, Bld: 100 mg/dL — ABNORMAL HIGH (ref 70–99)
Potassium: 4.3 mmol/L (ref 3.5–5.1)
Sodium: 140 mmol/L (ref 135–145)
Total Bilirubin: 0.9 mg/dL (ref 0.3–1.2)
Total Protein: 7.9 g/dL (ref 6.5–8.1)

## 2021-08-21 LAB — LACTATE DEHYDROGENASE: LDH: 136 U/L (ref 98–192)

## 2021-08-22 LAB — KAPPA/LAMBDA LIGHT CHAINS
Kappa free light chain: 15.7 mg/L (ref 3.3–19.4)
Kappa, lambda light chain ratio: 2.15 — ABNORMAL HIGH (ref 0.26–1.65)
Lambda free light chains: 7.3 mg/L (ref 5.7–26.3)

## 2021-08-22 LAB — BETA 2 MICROGLOBULIN, SERUM: Beta-2 Microglobulin: 1.2 mg/L (ref 0.6–2.4)

## 2021-08-23 ENCOUNTER — Other Ambulatory Visit: Payer: Self-pay

## 2021-08-23 ENCOUNTER — Ambulatory Visit (HOSPITAL_COMMUNITY)
Admission: RE | Admit: 2021-08-23 | Discharge: 2021-08-23 | Disposition: A | Discharge status: Home / Self Care | Payer: BC Managed Care – PPO | Source: Ambulatory Visit | Attending: Physician Assistant | Admitting: Physician Assistant

## 2021-08-23 DIAGNOSIS — Z806 Family history of leukemia: Secondary | ICD-10-CM | POA: Diagnosis not present

## 2021-08-23 DIAGNOSIS — E119 Type 2 diabetes mellitus without complications: Secondary | ICD-10-CM | POA: Diagnosis not present

## 2021-08-23 DIAGNOSIS — D472 Monoclonal gammopathy: Secondary | ICD-10-CM

## 2021-08-23 DIAGNOSIS — E785 Hyperlipidemia, unspecified: Secondary | ICD-10-CM | POA: Diagnosis not present

## 2021-08-23 DIAGNOSIS — Z808 Family history of malignant neoplasm of other organs or systems: Secondary | ICD-10-CM | POA: Diagnosis not present

## 2021-08-23 DIAGNOSIS — Z87891 Personal history of nicotine dependence: Secondary | ICD-10-CM | POA: Diagnosis not present

## 2021-08-26 ENCOUNTER — Telehealth: Payer: Self-pay | Admitting: *Deleted

## 2021-08-26 ENCOUNTER — Encounter: Payer: Self-pay | Admitting: Emergency Medicine

## 2021-08-26 LAB — MULTIPLE MYELOMA PANEL, SERUM
Albumin SerPl Elph-Mcnc: 4.2 g/dL (ref 2.9–4.4)
Albumin/Glob SerPl: 1.3 (ref 0.7–1.7)
Alpha 1: 0.2 g/dL (ref 0.0–0.4)
Alpha2 Glob SerPl Elph-Mcnc: 0.9 g/dL (ref 0.4–1.0)
B-Globulin SerPl Elph-Mcnc: 1 g/dL (ref 0.7–1.3)
Gamma Glob SerPl Elph-Mcnc: 1.4 g/dL (ref 0.4–1.8)
Globulin, Total: 3.4 g/dL (ref 2.2–3.9)
IgA: 147 mg/dL (ref 90–386)
IgG (Immunoglobin G), Serum: 998 mg/dL (ref 603–1613)
IgM (Immunoglobulin M), Srm: 457 mg/dL — ABNORMAL HIGH (ref 20–172)
M Protein SerPl Elph-Mcnc: 0.5 g/dL — ABNORMAL HIGH
Total Protein ELP: 7.6 g/dL (ref 6.0–8.5)

## 2021-08-26 LAB — UPEP/UIFE/LIGHT CHAINS/TP, 24-HR UR
% BETA, Urine: 42.3 %
ALPHA 1 URINE: 6 %
Albumin, U: 19.4 %
Alpha 2, Urine: 12.4 %
Free Kappa Lt Chains,Ur: 17.83 mg/L (ref 1.17–86.46)
Free Kappa/Lambda Ratio: 7.05 (ref 1.83–14.26)
Free Lambda Lt Chains,Ur: 2.53 mg/L (ref 0.27–15.21)
GAMMA GLOBULIN URINE: 19.9 %
Total Protein, Urine-Ur/day: 392 mg/24 hr — ABNORMAL HIGH (ref 30–150)
Total Protein, Urine: 18 mg/dL
Total Volume: 2175

## 2021-08-26 NOTE — Telephone Encounter (Signed)
PA for Ozempic  ?Key :[id number]  Approved  ?

## 2021-08-26 NOTE — Research (Addendum)
[facility] Screening Qualification Core 12-Jun-2021           Review of medications  Atorvastatin started 01-31-21 for dyslipidemia with hypertriglyceridemia by Dr Toma Aran Flexeril started 01-31-21 for chronic neck pain by Dr Joan Mayans Zetia started 01-31-21 for dyslipidemia with hypertriglyceridemia by Dr Randye Lobo started on 01-31-21 for dyslipidemia with hypertriglyceridemia by Dr Levert Feinstein Antivert started 05-15-21 as needed for dizziness  Triamcinolone Cream started 04-24-21 for rash by Dr. Agustina Caroli Zyrtec started 06-16-20 for allergies per pt Aspirin started 06-16-2013 for heart and stroke prevention

## 2021-08-27 ENCOUNTER — Telehealth: Payer: Self-pay | Admitting: Physician Assistant

## 2021-08-27 DIAGNOSIS — D472 Monoclonal gammopathy: Secondary | ICD-10-CM

## 2021-08-27 NOTE — Telephone Encounter (Signed)
I called and spoke to [name] and his wife to review the lab results from 08/21/2021. SPEP confirmed M protein measuring 0.5 g/dL. Immunofixation showed IgM monoclonal protein with kappa light chain specificity. sFLC ratio was elevated at 2.15. Recommend bone marrow biopsy as next step in evaluation. Patient understood the plan and agreed to move forward.  ?

## 2021-08-28 NOTE — Telephone Encounter (Signed)
This is a question for his oncologist.  He has the final word.

## 2021-09-03 ENCOUNTER — Ambulatory Visit: Payer: BC Managed Care – PPO

## 2021-09-03 ENCOUNTER — Other Ambulatory Visit: Payer: Self-pay

## 2021-09-03 DIAGNOSIS — G4733 Obstructive sleep apnea (adult) (pediatric): Secondary | ICD-10-CM | POA: Diagnosis not present

## 2021-09-10 ENCOUNTER — Encounter: Payer: Self-pay | Admitting: Emergency Medicine

## 2021-09-11 DIAGNOSIS — G4733 Obstructive sleep apnea (adult) (pediatric): Secondary | ICD-10-CM | POA: Diagnosis not present

## 2021-09-17 ENCOUNTER — Other Ambulatory Visit (INDEPENDENT_AMBULATORY_CARE_PROVIDER_SITE_OTHER): Payer: Self-pay | Admitting: Internal Medicine

## 2021-09-17 DIAGNOSIS — E785 Hyperlipidemia, unspecified: Secondary | ICD-10-CM

## 2021-09-18 ENCOUNTER — Encounter: Payer: Self-pay | Admitting: Physician Assistant

## 2021-09-19 ENCOUNTER — Other Ambulatory Visit (HOSPITAL_COMMUNITY): Payer: Self-pay | Admitting: Physician Assistant

## 2021-09-19 ENCOUNTER — Ambulatory Visit: Payer: BC Managed Care – PPO | Admitting: Internal Medicine

## 2021-09-19 ENCOUNTER — Other Ambulatory Visit: Payer: Self-pay | Admitting: Radiology

## 2021-09-19 ENCOUNTER — Encounter: Payer: Self-pay | Admitting: Internal Medicine

## 2021-09-19 VITALS — BP 110/68 | HR 65 | Ht 65.0 in | Wt 196.4 lb

## 2021-09-19 DIAGNOSIS — E783 Hyperchylomicronemia: Secondary | ICD-10-CM

## 2021-09-19 DIAGNOSIS — E785 Hyperlipidemia, unspecified: Secondary | ICD-10-CM | POA: Diagnosis not present

## 2021-09-19 MED ORDER — EZETIMIBE 10 MG PO TABS: 10.0000 mg | ORAL_TABLET | Freq: Every day | ORAL | 3 refills | Status: AC

## 2021-09-19 NOTE — Patient Instructions (Signed)
Medication Instructions:  ?RESUME zetia and atorvastatin  ? ?*If you need a refill on your cardiac medications before your next appointment, please call your pharmacy* ? ? ?Lab Work: ?FASTING lab work to check cholesterol and liver function in 3 months ? ?If you have labs (blood work) drawn today and your tests are completely normal, you will receive your results only by: ?MyChart Message (if you have MyChart) OR ?A paper copy in the mail ?If you have any lab test that is abnormal or we need to change your treatment, we will call you to review the results. ? ? ?Testing/Procedures: ?NONE ? ? ?Follow-Up: ?At [facility], you and your health needs are our priority.  As part of our continuing mission to provide you with exceptional heart care, we have created designated Provider Care Teams.  These Care Teams include your primary Cardiologist (physician) and Advanced Practice Providers (APPs -  Physician Assistants and Nurse Practitioners) who all work together to provide you with the care you need, when you need it. ? ?We recommend signing up for the patient portal called "MyChart".  Sign up information is provided on this After Visit Summary.  MyChart is used to connect with patients for Virtual Visits (Telemedicine).  Patients are able to view lab/test results, encounter notes, upcoming appointments, etc.  Non-urgent messages can be sent to your provider as well.   ?To learn more about what you can do with MyChart, go to [email or web address].   ? ?Your next appointment:   ?6 month(s) ? ?The format for your next appointment:   ?In Person ? ?Provider:   ?Lyman Bishop MD  ?-- call in May/June for an October 2023 appointment ? ?

## 2021-09-19 NOTE — Progress Notes (Signed)
? ? ? ?LIPID CLINIC CONSULT NOTE ? ?Chief Complaint:  ?Follow-up high triglycerides ? ?Primary Care Physician: ?Horald Pollen, MD ? ?Primary Cardiologist:  ?None ? ?HPI:  ?Daniel Hayden is a 56 y.o. male who is being seen today for the evaluation of triglycerides at the request of Horald Pollen, *.  This is a pleasant 56 year old male who recently moved to the area with his wife from [place].  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  He reports longstanding history of high triglycerides and was previously getting cardiac care from Dr. Ginnie Smart with [facility] cardiology in [place].  Most recently lipids show total cholesterol 157, triglycerides 1316, HDL's 16 and LDL 22.  He reports his triglycerides has been as high as 2200 in the past.  He is not aware of a family history of high triglycerides, but knows that he has had lifelong high triglycerides despite dietary changes and medications.  He was unfortunately just in the emergency department today with symptoms concerning for vertigo.  He underwent extensive testing. Apparently he has had prior cardiac work-up by his cardiologist in [place] and we will try to obtain those records.  Medical regimen appears to be good with atorvastatin 80 mg daily, ezetimibe 10 mg daily and Vascepa 2 g twice daily.  Was on fenofibrate but was stopped because it was felt to be "not effective". ? ?09/19/2021 ? ?Daniel Hayden is seen today in follow-up.  I actually had referred him for a clinical trial for the APO C3 inhibitor.  He had research labs drawn that showed marked improvement in triglycerides I believe that were in the 2-300 range, much lower than the cutoff for the entrance of the clinical trial therefore he did not qualify.  Since then he has had a number of unfortunate other issues including notable proteinuria.  Work-up indicated that there was an M spike in the urine protein electrophoresis.  He was seen by the hematologist  and has a bone marrow biopsy scheduled on April 7.  He said he had stopped his atorvastatin for couple days because he did some reading indicating that this could cause proteinuria.  I explained to him that his protein in the urine is a different phenomenon and not what we would typically see with chronic kidney disease or diabetic nephropathy.  Actually his current regimen seems to be working well with regards to his triglycerides and has been well-tolerated. ? ?PMHx:  ?Past Medical History:  ?Diagnosis Date  ? Diabetes mellitus without complication ([place])   ? Hyperlipidemia   ? Psoriasis   ? ? ?Past Surgical History:  ?Procedure Laterality Date  ? HERNIA REPAIR    ? ? ?FAMHx:  ?Family History  ?Problem Relation Age of Onset  ? Pancreatic cancer Sister   ? Leukemia Sister   ? Other Brother   ? Other Son   ? Asthma Daughter   ? ? ?SOCHx:  ? reports that he quit smoking about 26 years ago. His smoking use included cigarettes. He has a 7.50 pack-year smoking history. He has never used smokeless tobacco. He reports current alcohol use. He reports that he does not currently use drugs. ? ?ALLERGIES:  ?Allergies  ?Allergen Reactions  ? Cheese Rash  ?  Cheese/dairy  ? ? ?ROS: ?Pertinent items noted in HPI and remainder of comprehensive ROS otherwise negative. ? ?HOME MEDS: ?Current Outpatient Medications on File Prior to Visit  ?Medication Sig Dispense Refill  ? aspirin EC 81  MG tablet Take 81 mg by mouth daily. Swallow whole.    ? benzonatate (TESSALON PERLES) 100 MG capsule 1-2 capsules up to twice daily as needed for cough 30 capsule 0  ? cetirizine (ZYRTEC) 10 MG tablet Take 10 mg by mouth daily.    ? cyclobenzaprine (FLEXERIL) 10 MG tablet Take 1 tablet (10 mg total) by mouth at bedtime. 30 tablet 1  ? fluticasone (FLONASE) 50 MCG/ACT nasal spray Place 2 sprays into both nostrils daily. 16 g 6  ? icosapent Ethyl (VASCEPA) 1 g capsule Take 2 capsules (2 g total) by mouth in the morning and at bedtime. 360 capsule 2  ?  lidocaine (XYLOCAINE) 2 % solution Use as directed 15 mLs in the mouth or throat every 6 (six) hours as needed for mouth pain. 100 mL 0  ? Semaglutide,0.25 or 0.5MG/DOS, (OZEMPIC, 0.25 OR 0.5 MG/DOSE,) 2 MG/1.5ML SOPN Inject 0.5 mg into the skin once a week. 1.5 mL 5  ? triamcinolone cream (KENALOG) 0.1 % Apply 1 application topically 2 (two) times daily. (Patient taking differently: Apply 1 application. topically as needed.) 30 g 1  ? atorvastatin (LIPITOR) 80 MG tablet Take 80 mg by mouth daily. (Patient not taking: Reported on 09/19/2021)    ? ?No current facility-administered medications on file prior to visit.  ? ? ?LABS/IMAGING: ?No results found for this or any previous visit (from the past 48 hour(s)). ?No results found. ? ?LIPID PANEL: ?   ?Component Value Date/Time  ? CHOL 157 02/01/2021 0814  ? TRIG (H) 02/01/2021 0814  ?  1316.0 Triglyceride is over 400; calculations on Lipids are invalid.  ? HDL 16.60 (L) 02/01/2021 4259  ? CHOLHDL 9 02/01/2021 0814  ? LDLDIRECT 22.0 02/01/2021 0814  ? ? ?WEIGHTS: ?Wt Readings from Last 3 Encounters:  ?09/19/21 196 lb 6.4 oz (89.1 kg)  ?08/21/21 196 lb 4 oz (89 kg)  ?07/09/21 201 lb 6.4 oz (91.4 kg)  ? ? ?VITALS: ?BP 110/68   Pulse 65   Ht _0  (1.651 m)   Wt 196 lb 6.4 oz (89.1 kg)   SpO2 97%   BMI 32.68 kg/m?  ? ?EXAM: ?Deferred ? ?EKG: ?Deferred ? 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 ?PLAN: ?1.   Daniel Hayden had a surprising improvement in his lipids with triglycerides now down into the 2-300 range based on research labs and he did not qualify for the clinical trial.  Would continue with his current therapies including atorvastatin 80 mg, Zetia 10 mg and Vascepa 2 g twice daily.  Plan repeat lipids in about 3 to 4 months as well as a assessment of liver function.  Follow-up with me in 6 months or sooner as necessary. ? ?Pixie Casino, MD, [facility], FACP  ?[place]  ?Medical Director of the Advanced Lipid Disorders &  ?Cardiovascular  Risk Reduction Clinic ?Diplomate of the [employer] of Clinical Lipidology ?Attending Cardiologist  ?Direct Dial: [phone]  Fax: [phone]  ?Website:  [email or web address] ? ?Nadean Corwin Hilty ?09/19/2021, 9:14 AM  ? ?

## 2021-09-20 ENCOUNTER — Ambulatory Visit (HOSPITAL_COMMUNITY)
Admission: RE | Admit: 2021-09-20 | Discharge: 2021-09-20 | Disposition: A | Discharge status: Home / Self Care | Payer: BC Managed Care – PPO | Source: Ambulatory Visit | Attending: Physician Assistant | Admitting: Physician Assistant

## 2021-09-20 ENCOUNTER — Encounter (HOSPITAL_COMMUNITY): Payer: Self-pay

## 2021-09-20 ENCOUNTER — Other Ambulatory Visit: Payer: Self-pay

## 2021-09-20 DIAGNOSIS — L409 Psoriasis, unspecified: Secondary | ICD-10-CM | POA: Insufficient documentation

## 2021-09-20 DIAGNOSIS — E785 Hyperlipidemia, unspecified: Secondary | ICD-10-CM | POA: Insufficient documentation

## 2021-09-20 DIAGNOSIS — D751 Secondary polycythemia: Secondary | ICD-10-CM | POA: Diagnosis not present

## 2021-09-20 DIAGNOSIS — D472 Monoclonal gammopathy: Secondary | ICD-10-CM | POA: Insufficient documentation

## 2021-09-20 DIAGNOSIS — D704 Cyclic neutropenia: Secondary | ICD-10-CM | POA: Diagnosis not present

## 2021-09-20 DIAGNOSIS — E119 Type 2 diabetes mellitus without complications: Secondary | ICD-10-CM | POA: Insufficient documentation

## 2021-09-20 DIAGNOSIS — Z87891 Personal history of nicotine dependence: Secondary | ICD-10-CM | POA: Insufficient documentation

## 2021-09-20 LAB — CBC WITH DIFFERENTIAL/PLATELET
Abs Immature Granulocytes: 0.01 10*3/uL (ref 0.00–0.07)
Basophils Absolute: 0 10*3/uL (ref 0.0–0.1)
Basophils Relative: 1 %
Eosinophils Absolute: 0.2 10*3/uL (ref 0.0–0.5)
Eosinophils Relative: 5 %
HCT: 49.6 % (ref 39.0–52.0)
Hemoglobin: 17.2 g/dL — ABNORMAL HIGH (ref 13.0–17.0)
Immature Granulocytes: 0 %
Lymphocytes Relative: 42 %
Lymphs Abs: 1.9 10*3/uL (ref 0.7–4.0)
MCH: 29 pg (ref 26.0–34.0)
MCHC: 34.7 g/dL (ref 30.0–36.0)
MCV: 83.5 fL (ref 80.0–100.0)
Monocytes Absolute: 0.4 10*3/uL (ref 0.1–1.0)
Monocytes Relative: 9 %
Neutro Abs: 1.8 10*3/uL (ref 1.7–7.7)
Neutrophils Relative %: 43 %
Platelets: 229 10*3/uL (ref 150–400)
RBC: 5.94 MIL/uL — ABNORMAL HIGH (ref 4.22–5.81)
RDW: 18.5 % — ABNORMAL HIGH (ref 11.5–15.5)
WBC: 4.3 10*3/uL (ref 4.0–10.5)
nRBC: 0 % (ref 0.0–0.2)

## 2021-09-20 LAB — PROTIME-INR
INR: 1 (ref 0.8–1.2)
Prothrombin Time: 12.9 seconds (ref 11.4–15.2)

## 2021-09-20 LAB — GLUCOSE, CAPILLARY: Glucose-Capillary: 111 mg/dL — ABNORMAL HIGH (ref 70–99)

## 2021-09-20 MED ORDER — SODIUM CHLORIDE 0.9 % IV SOLN: INTRAVENOUS | Status: DC

## 2021-09-20 MED ORDER — MIDAZOLAM HCL 2 MG/2ML IJ SOLN
INTRAMUSCULAR | Status: AC
  Filled 2021-09-20: qty 4

## 2021-09-20 MED ORDER — FENTANYL CITRATE (PF) 100 MCG/2ML IJ SOLN
INTRAMUSCULAR | Status: AC
  Filled 2021-09-20: qty 2

## 2021-09-20 MED ORDER — MIDAZOLAM HCL 2 MG/2ML IJ SOLN
INTRAMUSCULAR | Status: AC | PRN
  Administered 2021-09-20: 1 mg via INTRAVENOUS

## 2021-09-20 MED ORDER — FENTANYL CITRATE (PF) 100 MCG/2ML IJ SOLN
INTRAMUSCULAR | Status: AC | PRN
  Administered 2021-09-20: 50 ug via INTRAVENOUS

## 2021-09-20 NOTE — Discharge Instructions (Signed)

## 2021-09-20 NOTE — Procedures (Signed)
Interventional Radiology Procedure:   Indications: MGUS  Procedure: CT guided bone marrow biopsy  Findings: 2 aspirates and 2 cores from right ilium  Complications: None     EBL: Minimal, less than 10 ml  Plan: Discharge to home in one hour.   Lilygrace Rodick R. Honestie Kulik, MD  Pager: [phone]   

## 2021-09-20 NOTE — Consult Note (Signed)
? ?Chief Complaint: ?Patient was seen in consultation today for CT-guided bone marrow biopsy ? ?Referring Physician(s): ?Dorsey,J ? ?Supervising Physician: Henn, Adam ? ?Patient Status: [facility] - Out-pt ? ?History of Present Illness: ?Daniel Hayden is a 55 y.o. male ex smoker with past medical history of diabetes, hyperlipidemia, and psoriasis who presents now with MGUS/abnormal SPEP with confirmed M protein, immunofixation showing IgM monoclonal protein with kappa light chain specificity.  He is scheduled today for CT-guided bone marrow biopsy for further evaluation. ? ?Past Medical History:  ?Diagnosis Date  ? Diabetes mellitus without complication ([facility])   ? Hyperlipidemia   ? Psoriasis   ? ? ?Past Surgical History:  ?Procedure Laterality Date  ? HERNIA REPAIR    ? ? ?Allergies: ?Metformin and related and Cheese ? ?Medications: ?Prior to Admission medications   ?Medication Sig Start Date End Date Taking? Authorizing Provider  ?aspirin EC 81 MG tablet Take 81 mg by mouth daily. Swallow whole.   Yes [provider]  ?benzonatate (TESSALON PERLES) 100 MG capsule 1-2 capsules up to twice daily as needed for cough 06/25/21  Yes Kim, Hannah R, DO  ?cetirizine (ZYRTEC) 10 MG tablet Take 10 mg by mouth daily.   Yes [provider]  ?cyclobenzaprine (FLEXERIL) 10 MG tablet Take 1 tablet (10 mg total) by mouth at bedtime. 01/31/21  Yes Sagardia, Miguel Jose, MD  ?ezetimibe (ZETIA) 10 MG tablet Take 1 tablet (10 mg total) by mouth daily. 09/19/21 12/18/21 Yes Hilty, Kenneth C, MD  ?fluticasone (FLONASE) 50 MCG/ACT nasal spray Place 2 sprays into both nostrils daily. 07/04/21  Yes Crawford, Elizabeth A, MD  ?icosapent Ethyl (VASCEPA) 1 g capsule Take 2 capsules (2 g total) by mouth in the morning and at bedtime. 08/16/21  Yes Hilty, Kenneth C, MD  ?lidocaine (XYLOCAINE) 2 % solution Use as directed 15 mLs in the mouth or throat every 6 (six) hours as needed for mouth pain. 07/04/21  Yes Crawford, Elizabeth A, MD   ?Semaglutide,0.25 or 0.5MG/DOS, (OZEMPIC, 0.25 OR 0.5 MG/DOSE,) 2 MG/1.5ML SOPN Inject 0.5 mg into the skin once a week. 08/19/21  Yes Sagardia, Miguel Jose, MD  ?triamcinolone cream (KENALOG) 0.1 % Apply 1 application topically 2 (two) times daily. ?Patient taking differently: Apply 1 application. topically as needed. 04/24/21  Yes Sagardia, Miguel Jose, MD  ?atorvastatin (LIPITOR) 80 MG tablet Take 80 mg by mouth daily. ?Patient not taking: Reported on 09/19/2021    [provider]  ?  ? ?Family History  ?Problem Relation Age of Onset  ? Pancreatic cancer Sister   ? Leukemia Sister   ? Other Brother   ? Other Son   ? Asthma Daughter   ? ? ?Social History  ? ?Socioeconomic History  ? Marital status: Married  ?  Spouse name: Not on file  ? Number of children: Not on file  ? Years of education: Not on file  ? Highest education level: Not on file  ?Occupational History  ? Not on file  ?Tobacco Use  ? Smoking status: Former  ?  Packs/day: 0.50  ?  Years: 15.00  ?  Pack years: 7.50  ?  Types: Cigarettes  ?  Quit date: 1997  ?  Years since quitting: 26.2  ? Smokeless tobacco: Never  ?Vaping Use  ? Vaping Use: Never used  ?Substance and Sexual Activity  ? Alcohol use: Yes  ?  Comment: occasional  ? Drug use: Not Currently  ? Sexual activity: Not on file  ?Other Topics   Concern  ? Not on file  ?Social History Narrative  ? Not on file  ? ?Social Determinants of Health  ? ?Financial Resource Strain: Not on file  ?Food Insecurity: Not on file  ?Transportation Needs: Not on file  ?Physical Activity: Not on file  ?Stress: Not on file  ?Social Connections: Not on file  ? ? ? ? ?Review of Systems denies fever,HA,CP,dyspnea, cough, abd pain, N/V or bleeding; he does have chronic back pain ? ?Vital Signs: ?Ht 5' 5" (1.651 m)   Wt 196 lb 6.4 oz (89.1 kg)   BMI 32.68 kg/m?  ? ?Physical Exam awake/alert; chest- CTA bilat; heart- RRR; abd- soft,+BS,NT; no LE edema ? ?Imaging: ?DG Bone Survey Met ? ?Result Date:  08/23/2021 ?CLINICAL DATA:  Monoclonal gammopathy EXAM: METASTATIC BONE SURVEY COMPARISON:  None. FINDINGS: No focal lytic lesions are seen. Severe degenerative changes are noted in the cervical spine with encroachment of neural foramina from C3-C7 levels. There is dextroscoliosis in the upper thoracic spine and levoscoliosis in the lower thoracic spine. Degenerative changes are noted in the lumbar spine, particularly severe at L5-S1 level. Lung fields are clear of any focal infiltrates. There is no pleural effusion or pneumothorax. Small linear densities in the lower lung fields suggest minimal scarring. IMPRESSION: No focal lytic lesions are seen. Other findings as described in the body of the report. Electronically Signed   By: Palani  Rathinasamy M.D.   On: 08/23/2021 20:20   ? ?Labs: ? ?CBC: ?Recent Labs  ?  05/13/21 ?1220 08/21/21 ?1115 09/20/21 ?0707  ?WBC 4.9 5.3 4.3  ?HGB 15.7 15.5 17.2*  ?HCT 49.9 50.7 49.6  ?PLT 159 197 229  ? ? ?COAGS: ?No results for input(s): INR, APTT in the last 8760 hours. ? ?BMP: ?Recent Labs  ?  02/01/21 ?0814 05/13/21 ?1220 08/21/21 ?1115  ?NA 137 136 140  ?K 4.0 4.0 4.3  ?CL 100 102 103  ?CO2 28 25 31  ?GLUCOSE 113* 148* 100*  ?BUN 14 12 16  ?CALCIUM 9.9 9.6 10.2  ?CREATININE 1.13 0.78 1.00  ?GFRNONAA  --  >60 >60  ? ? ?LIVER FUNCTION TESTS: ?Recent Labs  ?  02/01/21 ?0814 08/21/21 ?1115  ?BILITOT 0.7 0.9  ?AST 31 26  ?ALT 48 45*  ?ALKPHOS 61 61  ?PROT 7.4 7.9  ?ALBUMIN 4.4 4.9  ? ? ?TUMOR MARKERS: ?No results for input(s): AFPTM, CEA, CA199, CHROMGRNA in the last 8760 hours. ? ?Assessment and Plan: ?55 y.o. male ex smoker with past medical history of diabetes, hyperlipidemia, and psoriasis who presents now with MGUS/abnormal SPEP with confirmed M protein, immunofixation showing IgM monoclonal protein with kappa light chain specificity.  He is scheduled today for CT-guided bone marrow biopsy for further evaluation.Risks and benefits of procedure was discussed with the patient  ?   including, but not limited to bleeding, infection, damage to adjacent structures or low yield requiring additional tests. ? ?All of the questions were answered and there is agreement to proceed. ? ?Consent signed and in chart. ? ? ? ?Thank you for this interesting consult.  I greatly enjoyed meeting Daniel Hayden and look forward to participating in their care.  A copy of this report was sent to the requesting provider on this date. ? ?Electronically Signed: ?D. Kevin Allred, PA-C ?09/20/2021, 7:50 AM ? ? ?I spent a total of  20 minutes   in face to face in clinical consultation, greater than 50% of which was counseling/coordinating care for CT-guided bone marrow biopsy ? ?

## 2021-09-24 ENCOUNTER — Ambulatory Visit: Payer: BC Managed Care – PPO | Admitting: Emergency Medicine

## 2021-09-24 ENCOUNTER — Encounter: Payer: Self-pay | Admitting: Emergency Medicine

## 2021-09-24 VITALS — BP 110/72 | HR 74 | Temp 98.2°F | Ht 65.0 in | Wt 198.1 lb

## 2021-09-24 DIAGNOSIS — E1169 Type 2 diabetes mellitus with other specified complication: Secondary | ICD-10-CM | POA: Diagnosis not present

## 2021-09-24 DIAGNOSIS — E785 Hyperlipidemia, unspecified: Secondary | ICD-10-CM

## 2021-09-24 DIAGNOSIS — M542 Cervicalgia: Secondary | ICD-10-CM

## 2021-09-24 DIAGNOSIS — G4733 Obstructive sleep apnea (adult) (pediatric): Secondary | ICD-10-CM

## 2021-09-24 DIAGNOSIS — E781 Pure hyperglyceridemia: Secondary | ICD-10-CM | POA: Diagnosis not present

## 2021-09-24 DIAGNOSIS — G8929 Other chronic pain: Secondary | ICD-10-CM

## 2021-09-24 DIAGNOSIS — Z1211 Encounter for screening for malignant neoplasm of colon: Secondary | ICD-10-CM

## 2021-09-24 DIAGNOSIS — Z9989 Dependence on other enabling machines and devices: Secondary | ICD-10-CM

## 2021-09-24 DIAGNOSIS — D472 Monoclonal gammopathy: Secondary | ICD-10-CM

## 2021-09-24 LAB — POCT GLYCOSYLATED HEMOGLOBIN (HGB A1C): Hemoglobin A1C: 5.8 % — AB (ref 4.0–5.6)

## 2021-09-24 NOTE — Assessment & Plan Note (Signed)
Stable

## 2021-09-24 NOTE — Assessment & Plan Note (Signed)
Stable.  On CPAP therapy. ?

## 2021-09-24 NOTE — Progress Notes (Signed)
Lab Results  ?Component Value Date  ? HGBA1C 6.9 (H) 02/01/2021  ? ?BP Readings from Last 3 Encounters:  ?09/20/21 111/84  ?09/20/21 (!) 132/92  ?09/19/21 110/68  ? ?Lab Results  ?Component Value Date  ? CREATININE 1.00 08/21/2021  ? BUN 16 08/21/2021  ? NA 140 08/21/2021  ? K 4.3 08/21/2021  ? CL 103 08/21/2021  ? CO2 31 08/21/2021  ? ?Daniel Hayden ?56 y.o. ? ? ?Chief Complaint  ?Patient presents with  ? Medication Management  ?  Concerns about diabetes medication . Patient taking Farxiga and Ozempic.. Wants to discuss before taking Ozempic  ? ?Questions about Lisinopril   ? ? ?HISTORY OF PRESENT ILLNESS: ?This is a 56 y.o. male with history of diabetes and dyslipidemia here for follow-up. ?Presently taking Farxiga 10 mg daily.  Doing well.  Has no complaints or medical concerns. ?Non-smoker.  Not drinking anymore.  Eating better. ?Not taking lisinopril for the past 2 months. ?Recently diagnosed with MGUS.  Just had bone marrow biopsy done.  Results are still pending. ?Normal renal function test.  Normal renal ultrasound.  Normal bone scan. ?History of very high triglycerides on multiple medications.  Well-controlled. 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  ?PLAN: ?1.   Mr. Yutzy had a surprising improvement in his lipids with triglycerides now down into the 2-300 range based on research labs and he did not qualify for the clinical trial.  Would continue with his current therapies including atorvastatin 80 mg, Zetia 10 mg and Vascepa 2 g twice daily.  Plan repeat lipids in about 3 to 4 months as well as a assessment of liver function.  Follow-up with me in 6 months or sooner as necessary. ?  ?Pixie Casino, MD, [facility], FACP  ?[place]  ?Medical Director of the Advanced Lipid Disorders &  ?Cardiovascular Risk Reduction Clinic ?Diplomate of the [employer] of Clinical Lipidology ?Attending Cardiologist  ?Direct Dial:  [phone]  Fax: [phone]  ?Website:  [email or web address] ?  ?Daniel Hayden ?09/19/2021, 9:14 AM  ? ?Most recent oncologist office visit assessment and plan as follows: ?ASSESSMENT & PLAN ?Daniel Hayden is a 56 y.o. male who presents to the clinic for evaluation for monoclonal gammopathy. Based on outside labs, there is evidence of monoclonal protein measuring 0.5 g/dL.  Immunofixation showed IgM monoclonal protein with kappa light chain specificity.Free kappa light chain was elevated at 17.7 mg/L, free lambda light chain measured 8.1 mg/L and free light chain ratio was 2.19.  ?  ?The recommendation is to undergo a complete workup with CBC, CMP, multiple myeloma panel, kappa/lambda light chain, UPEP, beta 2 microglobulin and LDH levels. Bone Survey will be obtained to evaluate for lytic lesions. ?  ?#Monoclonal gammopathy, IgM with kappa light chain specificity: ?--Labs today to check CBC, CMP, multiple myeloma panel, kappa/lambda light chain, UPEP,beta 2 microglobulin and LDH levels.  ?--Need to obtain DG bone met surgery to evaluate for lytic lesions.  ?--Will determine need for bone marrow biopsy with above workup ?--RTC once workup is complete.  ? ?HPI ? ? ?Prior to Admission medications   ?Medication Sig Start Date End Date Taking? Authorizing Provider  ?aspirin EC 81 MG tablet Take 81 mg by mouth daily. Swallow whole.    [provider]  ?atorvastatin (LIPITOR) 80 MG tablet Take 80 mg by mouth daily. ?Patient not taking: Reported on 09/19/2021    [provider]  ?benzonatate (TESSALON PERLES) 100  MG capsule 1-2 capsules up to twice daily as needed for cough 06/25/21   Lucretia Kern, DO  ?cetirizine (ZYRTEC) 10 MG tablet Take 10 mg by mouth daily.    [provider]  ?cyclobenzaprine (FLEXERIL) 10 MG tablet Take 1 tablet (10 mg total) by mouth at bedtime. 01/31/21   Horald Pollen, MD  ?ezetimibe (ZETIA) 10 MG tablet Take 1 tablet (10 mg total) by mouth daily. 09/19/21 12/18/21   Pixie Casino, MD  ?fluticasone (FLONASE) 50 MCG/ACT nasal spray Place 2 sprays into both nostrils daily. 07/04/21   Hoyt Koch, MD  ?icosapent Ethyl (VASCEPA) 1 g capsule Take 2 capsules (2 g total) by mouth in the morning and at bedtime. 08/16/21   Hayden, Daniel Corwin, MD  ?lidocaine (XYLOCAINE) 2 % solution Use as directed 15 mLs in the mouth or throat every 6 (six) hours as needed for mouth pain. 07/04/21   Hoyt Koch, MD  ?Semaglutide,0.25 or 0.5MG/DOS, (OZEMPIC, 0.25 OR 0.5 MG/DOSE,) 2 MG/1.5ML SOPN Inject 0.5 mg into the skin once a week. 08/19/21   Horald Pollen, MD  ?triamcinolone cream (KENALOG) 0.1 % Apply 1 application topically 2 (two) times daily. ?Patient taking differently: Apply 1 application. topically as needed. 04/24/21   Horald Pollen, MD  ? ? ?Allergies  ?Allergen Reactions  ? Metformin And Related   ?  Sweating, dizzy  ? Cheese Rash  ?  Cheese/dairy  ? ? ?Patient Active Problem List  ? Diagnosis Date Noted  ? Monoclonal gammopathy 08/21/2021  ? COVID-19 07/05/2021  ? 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 Dyslipidemia 01/31/2021  ? Prediabetes 01/31/2021  ? OSA on CPAP 01/31/2021  ? Chronic neck pain 01/31/2021  ? ? ?Past Medical History:  ?Diagnosis Date  ? Diabetes mellitus without complication ([place])   ? Hyperlipidemia   ? Psoriasis   ? ? ?Past Surgical History:  ?Procedure Laterality Date  ? HERNIA REPAIR    ? ? ?Social History  ? ?Socioeconomic History  ? Marital status: Married  ?  Spouse name: Not on file  ? Number of children: Not on file  ? Years of education: Not on file  ? Highest education level: Not on file  ?Occupational History  ? Not on file  ?Tobacco Use  ? Smoking status: Former  ?  Packs/day: 0.50  ?  Years: 15.00  ?  Pack years: 7.50  ?  Types: Cigarettes  ?  Quit date: 1997  ?  Years since quitting: 26.2  ? Smokeless tobacco: Never  ?Vaping Use  ? Vaping Use: Never used  ?Substance and Sexual Activity  ? Alcohol use: Yes  ?   Comment: occasional  ? Drug use: Not Currently  ? Sexual activity: Not on file  ?Other Topics Concern  ? Not on file  ?Social History Narrative  ? Not on file  ? ?Social Determinants of Health  ? ?Financial Resource Strain: Not on file  ?Food Insecurity: Not on file  ?Transportation Needs: Not on file  ?Physical Activity: Not on file  ?Stress: Not on file  ?Social Connections: Not on file  ?Intimate Partner Violence: Not on file  ? ? ?Family History  ?Problem Relation Age of Onset  ? Pancreatic cancer Sister   ? Leukemia Sister   ? Other Brother   ? Other Son   ? Asthma Daughter   ? ? ? ?Review of Systems  ?Constitutional: Negative.  Negative for chills and fever.  ?HENT:  Negative.  Negative for congestion and sore throat.   ?Respiratory: Negative.  Negative for cough and shortness of breath.   ?Cardiovascular: Negative.  Negative for chest pain and palpitations.  ?Gastrointestinal: Negative.  Negative for abdominal pain, diarrhea, nausea and vomiting.  ?Genitourinary: Negative.  Negative for dysuria and hematuria.  ?Musculoskeletal: Negative.   ?Skin: Negative.  Negative for rash.  ?Neurological: Negative.  Negative for dizziness and headaches.  ?All other systems reviewed and are negative. ? ?Today's Vitals  ? 09/24/21 0804  ?Weight: 198 lb 2 oz (89.9 kg)  ?Height: _0  (1.651 m)  ? ?Body mass index is 32.97 kg/m?. ?Today's Vitals  ? 09/24/21 0804  ?BP: 110/72  ?Pulse: 74  ?Temp: 98.2 ?F (36.8 ?C)  ?TempSrc: Oral  ?SpO2: 100%  ?Weight: 198 lb 2 oz (89.9 kg)  ?Height: _1  (1.651 m)  ? ?Body mass index is 32.97 kg/m?. ?Wt Readings from Last 3 Encounters:  ?09/24/21 198 lb 2 oz (89.9 kg)  ?09/20/21 196 lb 6.4 oz (89.1 kg)  ?09/19/21 196 lb 6.4 oz (89.1 kg)  ? ? ?Physical Exam ?Vitals reviewed.  ?Constitutional:   ?   Appearance: Normal appearance.  ?HENT:  ?   Head: Normocephalic.  ?Eyes:  ?   Extraocular Movements: Extraocular movements intact.  ?   Pupils: Pupils are equal, round, and reactive to light.   ?Cardiovascular:  ?   Rate and Rhythm: Normal rate and regular rhythm.  ?   Pulses: Normal pulses.  ?   Heart sounds: Normal heart sounds.  ?Pulmonary:  ?   Effort: Pulmonary effort is normal.  ?   Breath sounds: Normal b

## 2021-09-24 NOTE — Patient Instructions (Signed)

## 2021-09-24 NOTE — Assessment & Plan Note (Signed)
Stable.  In the middle of work-up at present time. ?Awaiting bone marrow biopsy results. ?

## 2021-09-24 NOTE — Assessment & Plan Note (Signed)
Stable.  Continue atorvastatin 80 mg, Zetia 10 mg, and Vascepa 2 g twice a day. ?Well-controlled diabetes with hemoglobin A1c of 5.8.  Continue Farxiga 10 mg daily. ?Diet and nutrition discussed. ? ?

## 2021-09-25 LAB — SURGICAL PATHOLOGY

## 2021-09-26 ENCOUNTER — Telehealth: Payer: Self-pay | Admitting: Physician Assistant

## 2021-09-26 LAB — SURGICAL PATHOLOGY

## 2021-09-26 NOTE — Telephone Encounter (Signed)
Scheduled per 4/13 secure chat w/ irene, message has been left with pt ?

## 2021-09-26 NOTE — Telephone Encounter (Signed)
I spoke to [name] to review the bone marrow biopsy results. Findings are consistent with MGUS. Recommend to monitor and return in 3 months with repeat labs. Patient expressed understanding of the plan provided.  ?

## 2021-10-01 ENCOUNTER — Encounter (HOSPITAL_COMMUNITY): Payer: Self-pay

## 2021-10-05 ENCOUNTER — Encounter: Payer: Self-pay | Admitting: Emergency Medicine

## 2021-10-07 ENCOUNTER — Other Ambulatory Visit: Payer: Self-pay | Admitting: Emergency Medicine

## 2021-10-07 DIAGNOSIS — G8929 Other chronic pain: Secondary | ICD-10-CM

## 2021-10-07 MED ORDER — CYCLOBENZAPRINE HCL 10 MG PO TABS: 10.0000 mg | ORAL_TABLET | Freq: Every day | ORAL | 1 refills | Status: AC

## 2021-10-07 MED ORDER — MELOXICAM 15 MG PO TABS: 15.0000 mg | ORAL_TABLET | Freq: Every day | ORAL | 0 refills | Status: AC | PRN

## 2021-10-07 NOTE — Telephone Encounter (Signed)
Flexeril and meloxicam sent to pharmacy of record.  Thanks.

## 2021-10-15 ENCOUNTER — Encounter: Payer: Self-pay | Admitting: Internal Medicine

## 2021-10-15 ENCOUNTER — Encounter: Payer: Self-pay | Admitting: Physician Assistant

## 2021-10-15 ENCOUNTER — Encounter: Payer: Self-pay | Admitting: Emergency Medicine

## 2021-10-30 ENCOUNTER — Ambulatory Visit: Payer: Managed Care, Other (non HMO) | Admitting: Physician Assistant

## 2021-11-07 ENCOUNTER — Ambulatory Visit: Payer: BC Managed Care – PPO | Admitting: Internal Medicine

## 2021-12-24 ENCOUNTER — Telehealth: Payer: Self-pay | Admitting: Physician Assistant

## 2021-12-24 NOTE — Telephone Encounter (Signed)
Per 7/11 phone line pt called and said he has moved and no longer lives in the area. He is receiving care in the area where he lives now and no longer needed this appointment.  Appointment was canceled per his request

## 2021-12-25 ENCOUNTER — Inpatient Hospital Stay: Payer: BC Managed Care – PPO | Admitting: Physician Assistant

## 2021-12-25 ENCOUNTER — Inpatient Hospital Stay: Payer: BC Managed Care – PPO

## 2022-01-22 NOTE — Telephone Encounter (Signed)
lmtcb

## 2022-05-02 NOTE — Research (Signed)
Daniel Hayden Screening run In [place] 23-May-2021

## 2022-05-28 ENCOUNTER — Encounter: Payer: Self-pay | Admitting: Internal Medicine

## 2022-05-28 NOTE — Progress Notes (Signed)
Michael Gardner is a 56 y.o. male who is here today for an informal "meet the doctor" visit to see if there is mutual interest on behalf of the patient and provider in joining this practice model. I discussed the rationale of concierge medicine, our policies, my experience and background and our primary care focus. He will decide based on his needs and expectations, but if the decision is to join, then he will be scheduled for an annual exam.    Audery Amel, MD  [place] [address]

## 2022-06-24 ENCOUNTER — Other Ambulatory Visit: Payer: Self-pay | Admitting: Internal Medicine

## 2022-06-24 DIAGNOSIS — Z0000 Encounter for general adult medical examination without abnormal findings: Secondary | ICD-10-CM

## 2022-06-24 DIAGNOSIS — Z9189 Other specified personal risk factors, not elsewhere classified: Secondary | ICD-10-CM

## 2022-06-24 DIAGNOSIS — E559 Vitamin D deficiency, unspecified: Secondary | ICD-10-CM

## 2022-06-24 DIAGNOSIS — Z125 Encounter for screening for malignant neoplasm of prostate: Secondary | ICD-10-CM

## 2022-06-24 DIAGNOSIS — Z1159 Encounter for screening for other viral diseases: Secondary | ICD-10-CM

## 2022-06-24 DIAGNOSIS — R7303 Prediabetes: Secondary | ICD-10-CM

## 2022-06-24 DIAGNOSIS — E785 Hyperlipidemia, unspecified: Secondary | ICD-10-CM

## 2022-06-25 ENCOUNTER — Ambulatory Visit (FREE_STANDING_LABORATORY_FACILITY): Payer: BC Managed Care – PPO | Admitting: Internal Medicine

## 2022-06-25 ENCOUNTER — Encounter: Payer: Self-pay | Admitting: Internal Medicine

## 2022-06-25 VITALS — BP 129/88 | HR 68 | Temp 97.7°F | Ht 64.8 in | Wt 198.1 lb

## 2022-06-25 DIAGNOSIS — G4733 Obstructive sleep apnea (adult) (pediatric): Secondary | ICD-10-CM

## 2022-06-25 DIAGNOSIS — E785 Hyperlipidemia, unspecified: Secondary | ICD-10-CM

## 2022-06-25 DIAGNOSIS — D472 Monoclonal gammopathy: Secondary | ICD-10-CM

## 2022-06-25 DIAGNOSIS — Z125 Encounter for screening for malignant neoplasm of prostate: Secondary | ICD-10-CM

## 2022-06-25 DIAGNOSIS — H539 Unspecified visual disturbance: Secondary | ICD-10-CM

## 2022-06-25 DIAGNOSIS — Z1159 Encounter for screening for other viral diseases: Secondary | ICD-10-CM

## 2022-06-25 DIAGNOSIS — Z0000 Encounter for general adult medical examination without abnormal findings: Secondary | ICD-10-CM

## 2022-06-25 DIAGNOSIS — I2510 Atherosclerotic heart disease of native coronary artery without angina pectoris: Secondary | ICD-10-CM

## 2022-06-25 DIAGNOSIS — R7303 Prediabetes: Secondary | ICD-10-CM

## 2022-06-25 DIAGNOSIS — E559 Vitamin D deficiency, unspecified: Secondary | ICD-10-CM

## 2022-06-25 DIAGNOSIS — Z9189 Other specified personal risk factors, not elsewhere classified: Secondary | ICD-10-CM

## 2022-06-25 LAB — ECG 12-LEAD
Atrial Rate: 65 {beats}/min
IHS MUSE NARRATIVE AND IMPRESSION: NORMAL
P Axis: 56 degrees
P-R Interval: 148 ms
Q-T Interval: 404 ms
QRS Duration: 82 ms
QTC Calculation (Bezet): 420 ms
R Axis: 10 degrees
T Axis: -48 degrees
Ventricular Rate: 65 {beats}/min

## 2022-06-25 LAB — C-REACTIVE PROTEIN HIGH SENSITIVE: C-Reactive Protein, High Sensitive: 0.19 mg/dL (ref 0.00–1.00)

## 2022-06-25 NOTE — Progress Notes (Signed)
[facility] Maintenance Visit    Date: 06/25/2022 12:49 PM   Patient ID: Michael Gardner is a 57 y.o. male.    Chief Complaint:     Chief Complaint   Patient presents with    Annual Exam    Medication Refill     Atorvastatin 80 mg, Vascepa 1 G and Fenofibrate 160 mg, and Flexeril 10 mg     HPI:   Visit Type: Health Maintenance Visit  Work Status: working full-time  Reported Health: good health  Diet: inconsistent compliance with [employer] (ADA) diet  Exercise: recently not as regularly  Sleep: 6 hrs/night  Stress:Reports manageable level of daily stress.  Dental: regular dental visits twice a year  Vision: glasses and eye exam < 1 year ago (optometrist recommended ophthalmology evaluation for macular nevus)  Hearing: normal hearing  Dermatology:no current dermatologic screening  Reproductive Health: sexually active  Prior Screening Tests: last PSA in 2022 and last colonoscopy in 2022  General Health Risks: no family history of prostate cancer and no family history of colon cancer  Safety Elements Used: uses seat belts    OSA - uses CPAP but has not had any recent surveillance.    CACS 08/2020: 123 (primarily LAD); Calcified plaque within the  proximal and mid LAD causes minimal narrowing (less than 25%).  Stress Tess 08/2020:  Normal treadmill ECG stress test without evidence of exercise-induced  myocardial ischemia.      Problem List:     Patient Active Problem List   Diagnosis    Degeneration of intervertebral disc of cervical region    Obesity, unspecified    Obstructive sleep apnea    Prediabetes    Screening for colon cancer    Umbilical hernia    Hyperlipidemia    Coronary artery disease involving native coronary artery of native heart without angina pectoris     Current Medications:     Outpatient Medications Marked as Taking for the 06/25/22 encounter (Office Visit) with Rexene Alberts, MD   Medication Sig Dispense Refill    aspirin EC 81 MG EC tablet Take 1 tablet (81 mg) by mouth  daily      atorvastatin (LIPITOR) 80 MG tablet TAKE 1 TABLET BY MOUTH EVERY DAY 90 tablet 2    cetirizine (ZyrTEC) 10 MG tablet Take 1 tablet (10 mg total) by mouth daily 90 tablet 3    citalopram (CeleXA) 10 MG tablet Take 0.5 tablets (5 mg) by mouth daily      fenofibrate (LOFIBRA) 160 MG tablet Take 1 tablet (160 mg) by mouth daily      fluticasone (FLONASE) 50 MCG/ACT nasal spray 2 sprays by Nasal route      icosapent ethyl (VASCEPA) 1 g capsule Take 2 capsules (2 g total) by mouth 2 (two) times daily with meals 360 capsule 3    meloxicam (MOBIC) 15 MG tablet Take 1 tablet (15 mg) by mouth daily as needed for Pain      tacrolimus (PROTOPIC) 0.1 % ointment Apply topically      VITAMIN D PO Take 5,000 Units by mouth daily       Allergies:     Allergies   Allergen Reactions    Lactose     Metformin     Shellfish Allergy      Other reaction(s): high cholesterol--avoids    Cheese Rash     Cheese/dairy     Past Medical History:     Past Medical History:  Diagnosis Date    Hyperlipidemia     Psoriasis     Sleep apnea      Past Surgical History:     Past Surgical History:   Procedure Laterality Date    HERNIA REPAIR      TONSILECTOMY, ADENOIDECTOMY, BILATERAL MYRINGOTOMY AND TUBES      UVULECTOMY  2006     Family History:      Family History   Problem Relation Age of Onset    Diabetes Mother         Pre-diabetic    COPD Father     Myocardial Infarction Maternal Uncle 58    Myocardial Infarction Maternal Cousin 24    Sudden death Maternal Cousin      Social History:      Social History     Tobacco Use    Smoking status: Former     Packs/day: 0.50     Years: 13.00     Additional pack years: 0.00     Total pack years: 6.50     Types: Cigarettes     Quit date: 06/17/1995     Years since quitting: 27.0    Smokeless tobacco: Never   Vaping Use    Vaping Use: Never used   Substance Use Topics    Alcohol use: Yes     Alcohol/week: 7.0 standard drinks of alcohol     Types: 7 Cans of beer per week    Drug use: Never        The  following sections were reviewed this encounter by the provider:   Tobacco  Allergies  Meds  Problems  Med Hx  Surg Hx  Fam Hx  Soc Hx        Vitals:   BP 129/88 (BP Site: Right arm, Patient Position: Sitting, Cuff Size: Large)   Pulse 68   Temp 97.7 F (36.5 C) (Oral)   Ht 1.646 m (5' 4.8")   Wt 89.9 kg (198 lb 1.6 oz)   SpO2 96%   BMI 33.17 kg/m    Wt Readings from Last 3 Encounters:   06/25/22 89.9 kg (198 lb 1.6 oz)   11/05/20 93 kg (205 lb)   08/22/20 92.4 kg (203 lb 11.3 oz)       ROS:   Review of Systems   General/Constitutional:   Denies Change in appetite. Denies Chills. Denies Fatigue. Denies Fever. Denies Weight gain. Denies Weight loss.   Ophthalmologic:   Denies Blurred vision.   ENT:   Denies Hearing Loss. Denies Nasal Discharge. Denies Hoarseness. Denies Ear pain. Denies Nosebleed. Denies Sinus pain. Denies Sore throat. Denies Sneezing.   Endocrine:   Denies Polydipsia. Denies Polyuria.   Cardiovascular:   Denies Chest pain. Denies Chest pain with exertion. Denies Leg Claudication. Denies Palpitations. Denies Swelling in hands/feet.   Respiratory:   Denies Cough. Denies Orthopnea. Denies Shortness of breath. Denies Daytime Hypersomnolence. Denies Snoring. Denies Witness Apnea. Denies Wheezing.   Gastrointestinal:   Denies Abdominal pain. Denies Blood in stool. Denies Constipation. Denies Diarrhea. Denies Heartburn. Denies Nausea. Denies Vomiting.   Hematology:   Denies Easy bruising. Denies Easy Bleeding.   Genitourinary:   Denies Blood in urine. Denies Nocturia.   Musculoskeletal:   Denies Joint pain. Denies Leg cramps. Denies Weakness in LE. Denies Swollen joints.   Peripheral Vascular:   Denies Cold extremities. Denies Decreased sensation in extremities. Denies Painful extremities.   Skin:   Denies Itching. Denies Change in Mole(s). Denies Rash.  Neurologic:   Denies Balance difficulty. Denies Dizziness. Denies Headache. Denies Pre-Syncope. Denies Memory loss.   Psychiatric:    Denies Anxiety. Denies Depressed mood. Denies Difficulty sleeping. Denies Mood Swings.   Physical Exam:   Physical Exam   General Examination:   GENERAL APPEARANCE: alert, in no acute distress, well developed, well nourished, oriented to time, place, and person.   HEAD: normal appearance, atraumatic.   EYES: extraocular movement intact (EOMI), pupils equal, round, reactive to light and accommodation, sclera anicteric, conjunctiva clear.   EARS: tympanic membranes normal bilaterally, external canals normal .   NOSE: normal nasal mucosa, no lesions.   ORAL CAVITY: normal oropharynx, normal lips, mucosa moist, no lesions.   THROAT: normal appearance, clear, no erythema.   NECK/THYROID: neck supple, no carotid bruit, carotid pulse 2+ bilaterally, no cervical lymphadenopathy, no neck mass palpated, no jugular venous distention, no thyromegaly.   LYMPH NODES: no palpable adenopathy.   SKIN: good turgor, no rashes, no suspicious lesions.   HEART: S1, S2 normal, no murmurs, rubs, gallops, regular rate and rhythm.   LUNGS: normal effort / no distress, normal breath sounds, clear to auscultation bilaterally, no wheezes, rales, rhonchi.   ABDOMEN: bowel sounds present, no hepatosplenomegaly, soft, nontender, nondistended.   RECTAL: not examined   MALE GENITOURINARY: not examined  MUSCULOSKELETAL: full range of motion, no swelling or deformity.   EXTREMITIES: no edema, no clubbing, cyanosis, or edema.   PERIPHERAL PULSES: 2+ dorsalis pedis, 2+ posterior tibial.   NEUROLOGIC: nonfocal, cranial nerves 2-12 grossly intact, deep tendon reflexes 2+ symmetrical, normal strength, tone and reflexes, sensory exam intact.   PSYCH: cognitive function intact, mood/affect full range, speech clear.     Ancillary Testing:   ECG:  normal EKG, normal sinus rhythm, no acute ST-T changes  Audiology:     Speech Frequency Avg: L - normal R - normal   High Frequency Avg:L - normal R - normal   Vision:   Vision testing declined by patient.    In  Body:  Percent Body Fat:26 %  Visceral Fat Level:10  Skeletal Muscle Mass: 83 lbs  PHQ-9: 0       Assessment:    1. Preventative health care  - ECG 12 lead    2. Routine general medical examination at a health care facility  - Urinalysis  - TSH  - Comprehensive metabolic panel  - CBC and differential    3. Prediabetes  - Hemoglobin A1C    4. Obstructive sleep apnea  - Ambulatory referral to Sleep Medicine; Future    5. Hyperlipidemia, unspecified hyperlipidemia type  - CRP, High Sensitive  - Lipoprotein A (LPA)  - Lipid panel    6. MGUS (monoclonal gammopathy of unknown significance)  - Immunofixation electrophoresis, Serum; Future  - Protein electrophoresis, serum; Future  - Protein electrophoresis, serum  - Immunofixation electrophoresis, Serum    7. Change in vision  - Ambulatory referral to Ophthalmology; Future  Per recommendations from optometry  8. Vitamin D deficiency  - Vitamin D,25 OH, Total    9. At increased risk for cardiovascular disease  - CRP, High Sensitive  - Lipoprotein A (LPA)    10. Encounter for hepatitis C screening test for low risk patient  - Hepatitis C (HCV) antibody, Total    11. Screening for prostate cancer  - PROSTATE SPECIFIC ANTIGEN SCREEN    12. Coronary artery disease involving native coronary artery of native heart without angina pectoris      Plan:  Health Maintenance:   Recommend optimizing low carbohydrate diet efforts and obtaining at least 150 minutes of aerobic exercise per week. Recommend 20-25 grams of dietary fiber daily. Recommend drinking at least 60-80 ounces of water per day. Recommend optimizing low sodium diet measures ( less than 2 grams of sodium in the diet per day ). Recommend obtaining a seasonal Influenza vaccination.Vision screening UTD. Dental Screening UTD. Colonoscopy is UTD. Prostate cancer screening is due.  PSA ordered. COVID vaccination recommended to pt.   Prediabetes:  Prior A1c 6.1%.  Obtain prediabetic indices today.  Continue to optimize low  carbohydrate diet and aerobic exercise efforts to help lower the A1c.  Patient reports that he was briefly placed on Farxiga by cardiology in [place] but has not been taking it as his PCP at [facility] send did not take it.  CAD/hyperlipidemia:  CT coronary angiogram demonstrates nonobstructive CAD.  Continue to optimize heart healthy diet and exercise efforts.  Continue cholesterol therapy.  Monitor lipid indices.  MGUS:  Monitor kidney function, hemoglobin, calcium, and M spike.  OSA:  Patient is overdue for sleep medicine surveillance.    Follow-up:   Return in about 3 months (around 09/24/2022).     Audery Amel, MD

## 2022-06-25 NOTE — Progress Notes (Signed)
Audiology test: Hearing test administered to patient today. Reviewed results with patient. Patient stated understanding. Results reviewed by physician in detail.    Vision test:  Declined. Patient regularly has his vision teste with an optometrist.    Pulmonary Function Test: Deferred due to COVID-19 pandemic.    Fitness Evaluation:   Body Fat as percentage: In men, over 25% is obese, 20-25% is higher than normal, 16-20% is healthy / normal, <16% or under is considered lean / ideal.    2023 = 26.9%    Visceral Fat: Abdominal "belly" fat. Visceral fat is a type of body fat that exists in the abdomen and surrounds the internal organs. Everyone has some, especially those who are sedentary, chronically stressed, or maintain unhealthy diets. A different type of fat -- subcutaneous fat -- which builds up under the skin, has less of a negative impact on health and is easier to lose than visceral fat. A high level of visceral fat can increase your risk for serious health problems including cardiovascular disease, types 2 diabetes, and increased blood pressure. This risk goes up with a waist size that is greater than 40 inches for men. Maintain a Visceral Fat Level under 10 to stay healthy    2023 = 10    [employer] Recommendations: Discussed with patient to decrease body fat mass by approximately 27.8 lbs and to increase/decrease lean mass by approximately 0.0 lbs would be ideal.     Advanced Directive: Discussed advanced directive with patient today. Patient currently does not have an Advanced Directive. Provided patient with the [place] Advanced Directive for [employer]. Patient will send a copy of the advanced directive to be scanned into his chart once completed.    Patient Education:    Educated patient on importance of getting flu shot every year/season and the benefits as recommended by the CDC.   Educated patient on importance of getting yearly eye exam with an Optometrist for their eye health (e.g. Retina  scans, glaucoma checks, etc).  Hearing recommendations were always protect your hearing.     Annual Physical Blood work: Completed today along with [facility]                Updated patient's chart:  Allergies  Medications  History  Depression Screening  Health Maintenance  Immunizations  Vital signs  Pharmacy      Blood draw:  Using aseptic technique, blood was drawn from the left antecubital without any complication. Attempt # 1. Patient tolerated procedure well. All labs will be sent to [facility] refrigerated. CRP,HS will be sent to [facility] refrigerated..    -1 Gold Top SSTs     -3 Tiger Top/SST    -2 Lav    -1 Yellow/red top for UA    [facility] completed and sent with patient.    Patient tolerated procedure well and left in good condition today.

## 2022-06-26 ENCOUNTER — Other Ambulatory Visit: Payer: Self-pay

## 2022-06-26 DIAGNOSIS — E785 Hyperlipidemia, unspecified: Secondary | ICD-10-CM

## 2022-06-26 LAB — REFLEX - MICROSCOPIC EXAMINATION
Bacteria, UA: NONE SEEN
Casts, UA: NONE SEEN /lpf
Epithelial Cells (non renal): NONE SEEN /hpf (ref 0–10)

## 2022-06-26 LAB — URINALYSIS
Bilirubin, UA: NEGATIVE
Blood, UA: NEGATIVE
Glucose, UA: NEGATIVE
Ketones UA: NEGATIVE
Nitrite, UA: NEGATIVE
Protein, UR: NEGATIVE
Specific Gravity UA: 1.02 (ref 1.005–1.030)
Urine pH: 7 (ref 5.0–7.5)
Urobilinogen, UA: 0.2 mg/dL (ref 0.2–1.0)

## 2022-06-26 LAB — HEMOGLOBIN A1C: Hemoglobin A1C: 6.1 % — ABNORMAL HIGH (ref 4.8–5.6)

## 2022-06-26 LAB — INTERPRETATION:

## 2022-06-26 LAB — HEPATITIS C ANTIBODY, TOTAL: HCV AB: NONREACTIVE

## 2022-06-26 LAB — PSA TOTAL, ANNUAL SCREENING: Prostate Specific Antigen, Total: 0.8 ng/mL (ref 0.0–4.0)

## 2022-06-26 MED ORDER — ATORVASTATIN CALCIUM 80 MG PO TABS
80.0000 mg | ORAL_TABLET | Freq: Every day | ORAL | 3 refills | Status: DC
Start: 2022-06-26 — End: 2023-06-14

## 2022-06-26 MED ORDER — CYCLOBENZAPRINE HCL 10 MG PO TABS
10.0000 mg | ORAL_TABLET | Freq: Every evening | ORAL | 3 refills | Status: AC | PRN
Start: 2022-06-26 — End: 2022-07-26

## 2022-06-26 MED ORDER — ICOSAPENT ETHYL 1 G PO CAPS
2.0000 g | ORAL_CAPSULE | Freq: Two times a day (BID) | ORAL | 3 refills | Status: DC
Start: 2022-06-26 — End: 2022-07-24

## 2022-06-26 MED ORDER — FENOFIBRATE 160 MG PO TABS
160.0000 mg | ORAL_TABLET | Freq: Every day | ORAL | 3 refills | Status: DC
Start: 2022-06-26 — End: 2023-07-21

## 2022-06-26 NOTE — Telephone Encounter (Signed)
Michael Gardner called said when he was here yesterday he gave the medication list to the nurse to get refill.  Michael Gardner added that he does not want Statin, he wants Zetia instead please.    Confirm pharmacy [facility].  Thanks

## 2022-06-27 LAB — VITAMIN D, 25 OH, TOTAL

## 2022-06-27 LAB — LIPOPROTEIN A (LPA)

## 2022-07-01 ENCOUNTER — Encounter: Payer: Self-pay | Admitting: Internal Medicine

## 2022-07-01 LAB — SERUM PROTEIN ELECTROPHORESIS
Albumin Electrophoresis: 4.1 g/dL (ref 2.9–4.4)
Albumin/Globulin Ratio: 1.3 (ref 0.7–1.7)
Alpha 1: 0.2 g/dL (ref 0.0–0.4)
Alpha 2: 0.9 g/dL (ref 0.4–1.0)
Beta: 0.9 g/dL (ref 0.7–1.3)
Gamma Globulin: 1.2 g/dL (ref 0.4–1.8)
Globulin, Total: 3.2 g/dL (ref 2.2–3.9)
M spike: 0.6 g/dL — ABNORMAL HIGH
Protein, Total: 7.3 g/dL (ref 6.0–8.5)

## 2022-07-23 ENCOUNTER — Other Ambulatory Visit: Payer: Self-pay | Admitting: Internal Medicine

## 2022-08-06 ENCOUNTER — Encounter: Payer: Self-pay | Admitting: Internal Medicine

## 2022-08-20 ENCOUNTER — Other Ambulatory Visit: Payer: Self-pay | Admitting: Internal Medicine

## 2022-08-20 MED ORDER — TACROLIMUS 0.1 % EX OINT
TOPICAL_OINTMENT | Freq: Two times a day (BID) | CUTANEOUS | 5 refills | Status: AC
Start: 2022-08-20 — End: 2023-08-20

## 2022-08-20 MED ORDER — CITALOPRAM HYDROBROMIDE 10 MG PO TABS
5.0000 mg | ORAL_TABLET | Freq: Every day | ORAL | 3 refills | Status: AC
Start: 2022-08-20 — End: ?

## 2022-08-28 ENCOUNTER — Encounter: Payer: Self-pay | Admitting: Internal Medicine

## 2022-08-28 ENCOUNTER — Other Ambulatory Visit (HOSPITAL_COMMUNITY): Payer: Self-pay

## 2022-08-31 IMAGING — CT CT ANGIO HEAD-NECK (W OR W/O PERF)
2 of 12 series · 6 of 34 positions shown · IV contrast (omnipaque)
Comparison: None.

CLINICAL DATA: Dizziness this morning after going to bed with a
headache last night

EXAM:
CT ANGIOGRAPHY HEAD AND NECK
TECHNIQUE: Multidetector CT imaging of the head and neck was performed using
the standard protocol during bolus administration of intravenous
contrast. Multiplanar CT image reconstructions and MIPs were
obtained to evaluate the vascular anatomy. Carotid stenosis
measurements (when applicable) are obtained utilizing NASCET
criteria, using the distal internal carotid diameter as the
denominator.
CONTRAST:  75mL OMNIPAQUE IOHEXOL 350 MG/ML SOLN

[Series 9: sagittal soft · sagittal · 0.32mm/px · 1 of 60 slices shown]
[im 26/60  soft-tissue]
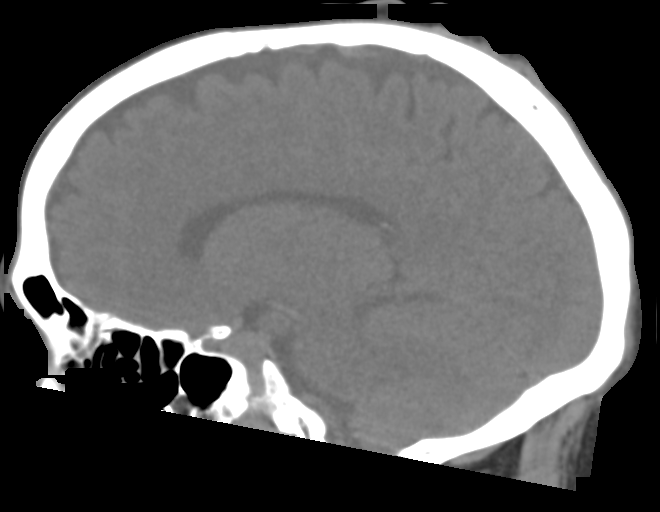

[Series 12: ax thin · axial · 0.56mm/px · z∈[-332,-89]mm · 5 of 365 slices shown]
[im 61/365  soft-tissue]
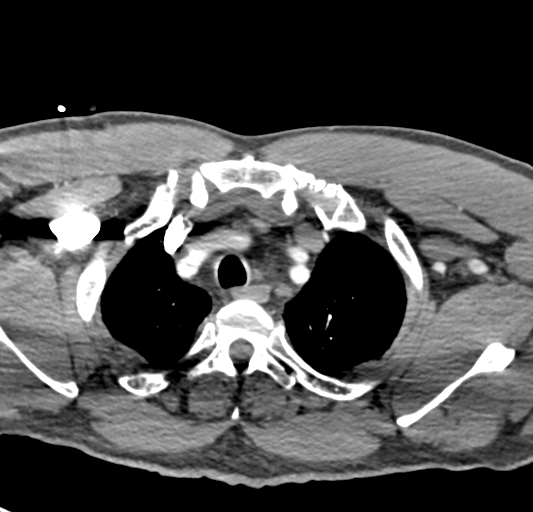
[im 122/365  bone]
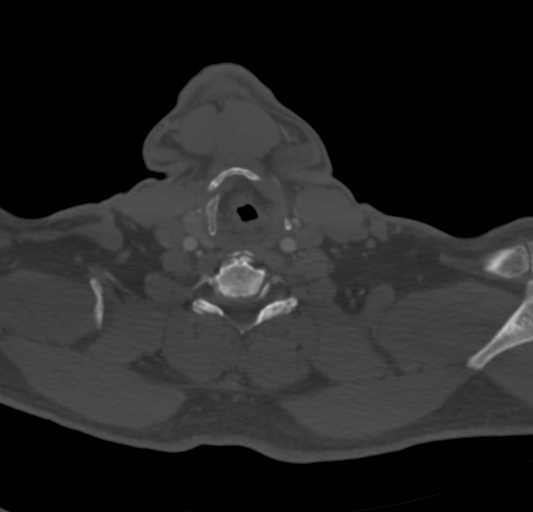
[im 183/365  soft-tissue]
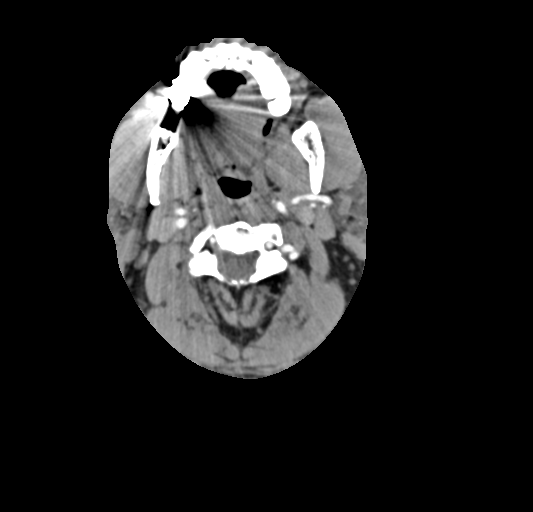
[im 243/365  bone]
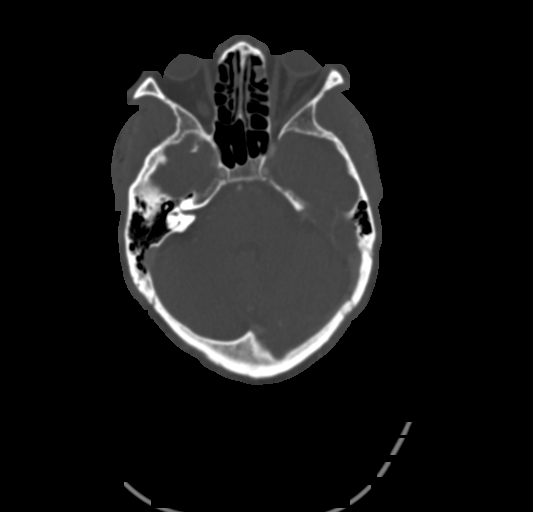
[im 304/365  soft-tissue]
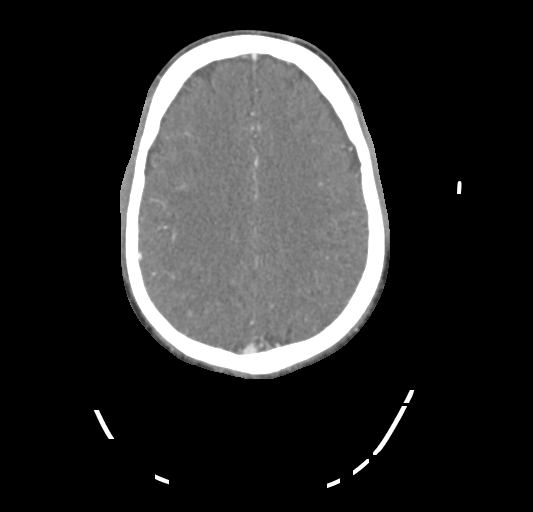

[6 of 34 positions shown; findings below may reference images not displayed]

FINDINGS: CT HEAD FINDINGS

Brain: There is no evidence of acute intracranial hemorrhage,
extra-axial fluid collection, or acute infarct.

Parenchymal volume is normal. The ventricles are normal in size.
There is no mass lesion. There is no midline shift.

Vascular: See below.

Skull: Normal. Negative for fracture or focal lesion.

Sinuses: The paranasal sinuses are clear.

Orbits: The globes and orbits are unremarkable.

Review of the MIP images confirms the above findings

CTA NECK FINDINGS

Aortic arch: Standard branching. Imaged portion shows no evidence of
aneurysm or dissection. No significant stenosis of the major arch
vessel origins.

Right carotid system: There is minimal calcified atherosclerotic
plaque in the right carotid bulb without hemodynamically significant
stenosis or occlusion. There is no dissection or aneurysm. The right
common and external carotid arteries are patent.

Left carotid system: The left common, internal, and external carotid
arteries are patent, without hemodynamically significant stenosis,
occlusion, dissection, or aneurysm.

Vertebral arteries: The vertebral arteries are patent, without
hemodynamically significant stenosis, occlusion, dissection, or
aneurysm.

Skeleton: There is mild degenerative change throughout the cervical
spine. There is no visible canal hematoma. There is no acute osseous
abnormality or aggressive osseous lesion.

Other neck: The soft tissues are unremarkable.

Upper chest: The imaged lung apices are clear.

Review of the MIP images confirms the above findings

CTA HEAD FINDINGS

Anterior circulation: The intracranial ICAs are patent.

The bilateral MCAs are patent. There is mild-to-moderate focal
stenosis of an inferior right M2 branch (13-200).

The bilateral ACAs are patent. The left A1 segment is diminutive,
likely a developmental variant.

There is no aneurysm.

Posterior circulation: The bilateral V4 segments are patent. The
basilar artery is patent.

The bilateral PCAs are patent. The posterior communicating arteries
are not identified.

There is no aneurysm.

Venous sinuses: Patent.

Anatomic variants: As above.

Review of the MIP images confirms the above findings
IMPRESSION: 1. No acute intracranial pathology or large vessel occlusion.
2. Mild-to-moderate focal stenosis of an inferior right M2 branch.
Otherwise, patent vasculature of the head and neck, without other
significant stenosis, occlusion, dissection, or aneurysm.

## 2022-10-08 ENCOUNTER — Encounter: Payer: Self-pay | Admitting: Internal Medicine

## 2022-11-04 ENCOUNTER — Other Ambulatory Visit: Payer: Self-pay | Admitting: Family Medicine

## 2022-11-04 DIAGNOSIS — E785 Hyperlipidemia, unspecified: Secondary | ICD-10-CM

## 2022-11-04 DIAGNOSIS — R7303 Prediabetes: Secondary | ICD-10-CM

## 2022-11-04 NOTE — Progress Notes (Signed)
Orders entered for upcoming lab draw as requested by patient

## 2022-11-05 NOTE — Progress Notes (Signed)
Blood draw:  Using aseptic technique, blood was drawn from the right antecubital without any complication. Attempt # 1. Patient tolerated procedure well. All labs will be sent to [facility] at room temperature.    -1 SSTs      -1 Lav     Patient tolerated procedure well and left in good condition today.

## 2022-11-06 ENCOUNTER — Ambulatory Visit (INDEPENDENT_AMBULATORY_CARE_PROVIDER_SITE_OTHER): Payer: BC Managed Care – PPO

## 2022-11-06 DIAGNOSIS — R7303 Prediabetes: Secondary | ICD-10-CM

## 2022-11-06 DIAGNOSIS — E785 Hyperlipidemia, unspecified: Secondary | ICD-10-CM

## 2022-11-07 LAB — LIPID PANEL
Cholesterol / HDL Ratio: 17.3 ratio — ABNORMAL HIGH (ref 0.0–5.0)
Cholesterol: 259 mg/dL — ABNORMAL HIGH (ref 100–199)
HDL: 15 mg/dL — ABNORMAL LOW (ref >39)
Triglycerides: 1686 mg/dL (ref 0–149)

## 2022-11-07 LAB — HEMOGLOBIN A1C: Hemoglobin A1C: 6 % — ABNORMAL HIGH (ref 4.8–5.6)

## 2022-11-07 NOTE — Progress Notes (Signed)
Good morning Michael Gardner,    You are scheduled to see Dr Lula Olszewski next Wednesday to discuss your cholesterol and glucose report.    The report is now available for your review as well.    In Good Health,    [name] , MD 8:05 AM 11/07/2022

## 2022-11-12 ENCOUNTER — Other Ambulatory Visit: Payer: Self-pay | Admitting: Internal Medicine

## 2022-11-12 ENCOUNTER — Ambulatory Visit: Payer: BC Managed Care – PPO | Admitting: Internal Medicine

## 2022-11-12 ENCOUNTER — Encounter: Payer: Self-pay | Admitting: Internal Medicine

## 2022-11-12 VITALS — BP 134/87 | HR 71 | Temp 97.6°F | Resp 14

## 2022-11-12 DIAGNOSIS — I2510 Atherosclerotic heart disease of native coronary artery without angina pectoris: Secondary | ICD-10-CM

## 2022-11-12 DIAGNOSIS — R7303 Prediabetes: Secondary | ICD-10-CM

## 2022-11-12 DIAGNOSIS — E785 Hyperlipidemia, unspecified: Secondary | ICD-10-CM

## 2022-11-12 DIAGNOSIS — D472 Monoclonal gammopathy: Secondary | ICD-10-CM

## 2022-11-12 MED ORDER — EMPAGLIFLOZIN 10 MG PO TABS
10.0000 mg | ORAL_TABLET | Freq: Every morning | ORAL | 3 refills | Status: DC
Start: 2022-11-12 — End: 2023-05-20

## 2022-11-12 NOTE — Progress Notes (Signed)
[facility] Office Note           Michael Gardner is a 57 y.o.  male.   HPI:    HPI  Pt reports for follow-up hyperlipidemia/prediabetes.  Pt takes daily walks.  Pt is off [place] [employer] (ran out).  Pt has a h/o elevated triglycerides - compliant with statn/fibrate/fish oil.  Pt admits to drinking alcohol - variable from 2 beers a day and weekend scotch.  Since obtaining his lab results, he has stopped all alcohol. Reports mild myalgia - "tolerable".  Pt attempts to limit carbs in his diet.  He did not start taking Ozempic due to concerns about side effects.    Lab Results   Component Value Date    HGBA1C 6.0 (H) 11/06/2022        CTA Chest: 2022  CALCIUM SCORING:   Left main:                 0  Left anterior descending:  123  Left circumflex:           0  Right Coronary:            0  Posterior Descending:      0  TOTAL CAC SCORE:           123  1.  Coronary artery disease. Calcified plaque within the proximal and mid  LAD causes minimal narrowing (less than 25%).  2. No significant stenosis.        ROS    Review of Systems  General/Constitutional:   Denies Chills. Denies Fever.   Respiratory:   Denies Shortness of breath. Denies Wheezing.   Cardiovascular:   Denies Chest pain.Denies Palpitations. Denies Swelling in hands/feet.   Gastrointestinal:   Denies Abdominal pain. Denies Nausea. Denies Vomiting.   Neurologic:   Denies Dizziness. Denies Headache.           Vitals:    BP 134/87 (BP Site: Left arm, Patient Position: Sitting, Cuff Size: Medium)   Pulse 71   Temp 97.6 F (36.4 C) (Oral)   Resp 14   SpO2 97%        PE:    Physical Exam  GENERAL APPEARANCE: alert, in no acute distress, well developed  NECK/THYROID: neck supple, no carotid bruit,  no cervical lymphadenopathy  HEART: S1, S2 normal, no murmurs, rubs, gallops, regular rate and rhythm.   LUNGS: normal effort / no distress, normal breath sounds, clear to auscultation bilaterally, no wheezes, rales, rhonchi.   EXTREMITIES: no edema B/L.          Visit  Diagnosis/Orders:    1. Prediabetes  - empagliflozin (Jardiance) 10 MG tablet; Take 1 tablet (10 mg) by mouth every morning  Dispense: 90 tablet; Refill: 3    2. Hyperlipidemia, unspecified hyperlipidemia type    3. MGUS (monoclonal gammopathy of unknown significance)  - CBC and differential; Future  - Comprehensive metabolic panel; Future  - Immunofixation electrophoresis, Serum; Future  - URINE IMMUNOFIXATION; Future    4. Coronary artery disease involving native coronary artery of native heart without angina pectoris  - empagliflozin (Jardiance) 10 MG tablet; Take 1 tablet (10 mg) by mouth every morning  Dispense: 90 tablet; Refill: 3        Assessment/Plan:    1. Prediabetes   Continue to optimize low carbohydrate diet and aerobic exercise measures.  Restart SGLT therapy -insurance formulary favors coverage of Jardiance.  Will recommend Jardiance 10 mg daily (goal is to reduce the  A1c and to help with reducing future cardiac events)   2. Hyperlipidemia  Significant hypertriglyceridemia.  Patient has a long history of hypertriglyceridemia on 3 combined pharmaceutical agents and notes that triglycerides do respond to alcohol cessation, diet and exercise.  Patient is willing to abstain from alcohol over the next 3 months as well as work on improving his diet and exercise and repeat the lipid panel.  Recommend lipid specialist consult if lipid indices do not improve on triple therapy.   3. MGUS (monoclonal gammopathy of unknown significance)   Obtain CBC and serum/urine phoresis monitoring labs.   4. Coronary artery disease involving native coronary artery of native heart without angina pectoris   Reviewed prior CTA chest in 2022.  Continue to optimize heart healthy diet and aerobic exercise measures.  Add Jardiance as above.  Continue aspirin, statin therapy.           Follow-up:    Return in about 3 months (around 02/12/2023) for hyperlipidemia, pre-DM.      Audery Amel, MD

## 2022-11-12 NOTE — Progress Notes (Signed)
Blood Work:     Fasting? = No    Location of stick: Right AC using aseptic technique.  Outcome: Patient tolerated well without any complications. Attempt #1    Collected:   2 Tiger top tubes. Spun. [facility]  1 Lavender EDTA top tubes. [facility].  1 Sterile urine cup top tube. [facility].    Lab/Temperature: [facility]: Refrigerated

## 2022-11-13 LAB — CBC AND DIFFERENTIAL
Baso(Absolute): 0 10*3/uL (ref 0.0–0.2)
Basophils Automated: 1 %
Eosinophils Absolute: 0.4 10*3/uL (ref 0.0–0.4)
Eosinophils Automated: 8 %
Hematocrit: 48.7 % (ref 37.5–51.0)
Hemoglobin: 15.8 g/dL (ref 13.0–17.7)
Immature Granulocytes Absolute: 0 10*3/uL (ref 0.0–0.1)
Immature Granulocytes: 1 %
Lymphocytes Absolute: 1.5 10*3/uL (ref 0.7–3.1)
Lymphocytes Automated: 34 %
MCH: 26.1 pg — ABNORMAL LOW (ref 26.6–33.0)
MCHC: 32.4 g/dL (ref 31.5–35.7)
MCV: 81 fL (ref 79–97)
Monocytes Absolute: 0.4 10*3/uL (ref 0.1–0.9)
Monocytes: 10 %
Neutrophils Absolute Count: 2.1 10*3/uL (ref 1.4–7.0)
Neutrophils: 46 %
Platelets: 226 10*3/uL (ref 150–450)
RBC: 6.05 x10E6/uL — ABNORMAL HIGH (ref 4.14–5.80)
RDW: 16.3 % — ABNORMAL HIGH (ref 11.6–15.4)
WBC: 4.4 10*3/uL (ref 3.4–10.8)

## 2022-11-14 LAB — COMPREHENSIVE METABOLIC PANEL
ALT: 20 IU/L (ref 0–44)
AST (SGOT): 29 IU/L (ref 0–40)
Albumin/Globulin Ratio: 1.6 (ref 1.2–2.2)
Albumin: 4.5 g/dL (ref 3.8–4.9)
Alkaline Phosphatase: 81 IU/L (ref 44–121)
BUN / Creatinine Ratio: 12 (ref 9–20)
BUN: 15 mg/dL (ref 6–24)
Bilirubin, Total: 0.3 mg/dL (ref 0.0–1.2)
CO2: 19 mmol/L — ABNORMAL LOW (ref 20–29)
Calcium: 9.7 mg/dL (ref 8.7–10.2)
Chloride: 99 mmol/L (ref 96–106)
Creatinine: 1.21 mg/dL (ref 0.76–1.27)
Globulin, Total: 2.9 g/dL (ref 1.5–4.5)
Glucose: 105 mg/dL — ABNORMAL HIGH (ref 70–99)
Potassium: 4.9 mmol/L (ref 3.5–5.2)
Protein, Total: 7.4 g/dL (ref 6.0–8.5)
Sodium: 137 mmol/L (ref 134–144)
eGFR: 70 mL/min/{1.73_m2} (ref >59)

## 2022-11-19 LAB — URINE IMMUNOFIXATION ELECTROPHORESIS WITH REVIEW: Immunofixation, UR: NORMAL

## 2022-11-21 LAB — SERUM IMMUNOFIXATION ELECTROPHORESIS WITH REVIEW
Albumin Electrophoresis: 3.8 g/dL (ref 2.9–4.4)
Albumin/Globulin Ratio: 1.1 (ref 0.7–1.7)
Alpha 1: 0.2 g/dL (ref 0.0–0.4)
Alpha 2: 1 g/dL (ref 0.4–1.0)
Beta: 1 g/dL (ref 0.7–1.3)
Gamma Globulin: 1.4 g/dL (ref 0.4–1.8)
Globulin, Total: 3.6 g/dL (ref 2.2–3.9)
IgM: 491 mg/dL — ABNORMAL HIGH (ref 20–172)
Immunoglobulin A: 132 mg/dL (ref 90–386)
M spike: 0.6 g/dL — ABNORMAL HIGH
Total IgG: 1021 mg/dL (ref 603–1613)

## 2022-11-22 ENCOUNTER — Other Ambulatory Visit: Payer: Self-pay | Admitting: Internal Medicine

## 2022-11-22 DIAGNOSIS — D472 Monoclonal gammopathy: Secondary | ICD-10-CM

## 2022-11-25 IMAGING — US US RENAL
1 series · 14 of 25 positions shown · non-contrast
Comparison: None.

CLINICAL DATA: Hypertension, renal dysfunction

EXAM:
RENAL / URINARY TRACT ULTRASOUND COMPLETE

[Series 1: us renal · 0.23mm/px · 14 of 36 slices shown]
[im 1/36]
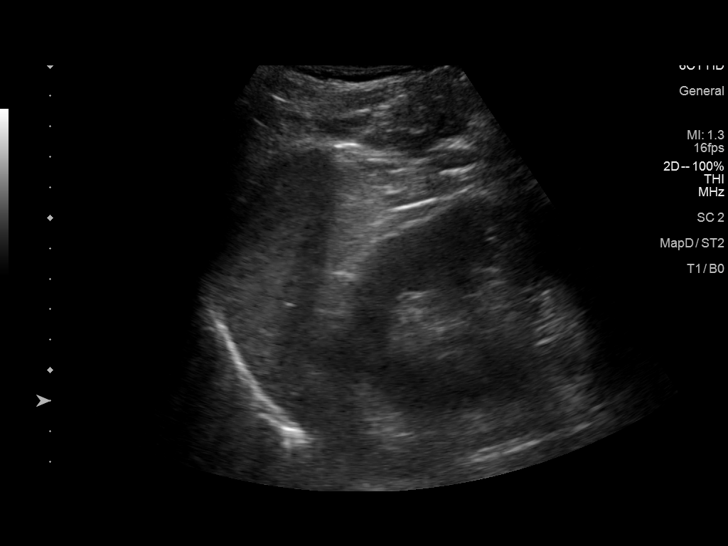
[im 3/36]
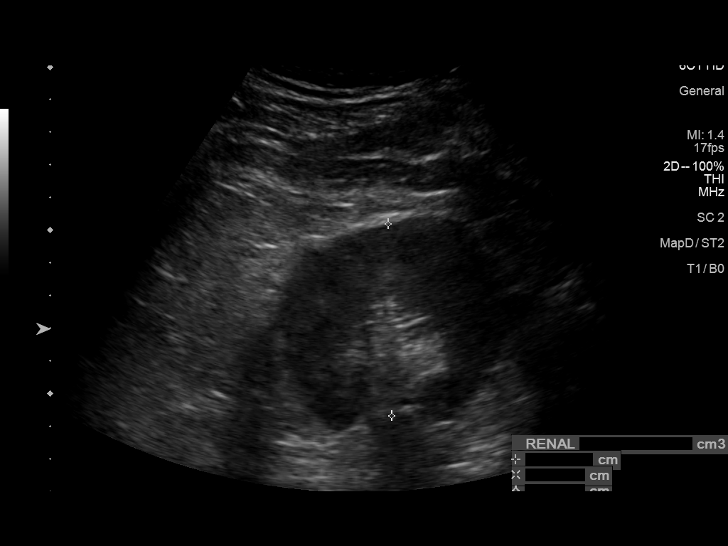
[im 6/36]
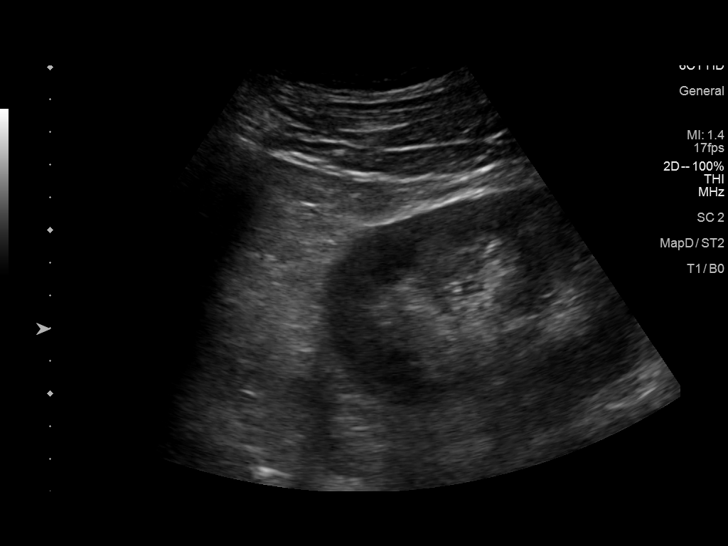
[im 9/36]
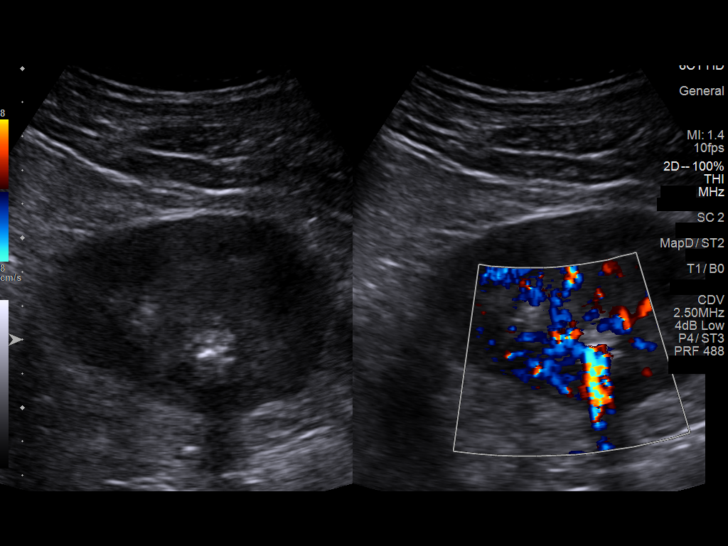
[im 12/36]
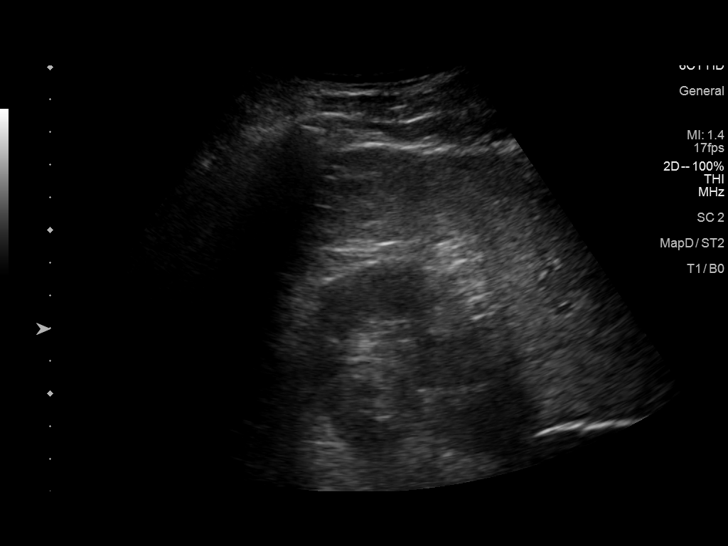
[im 14/36]
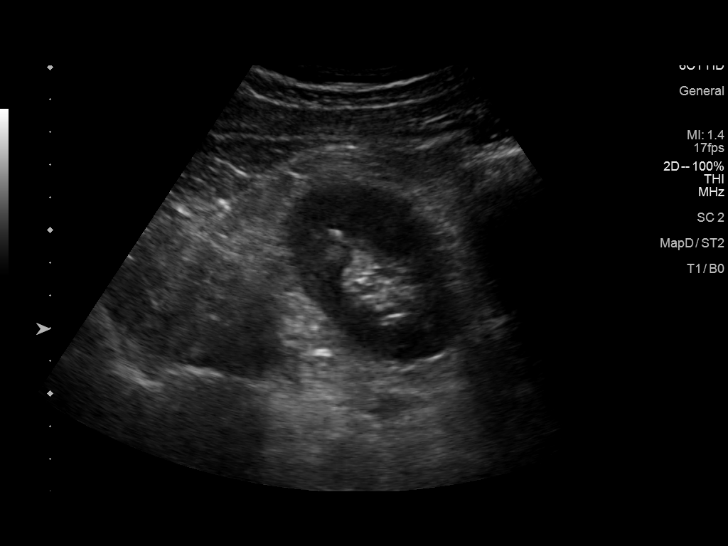
[im 17/36]
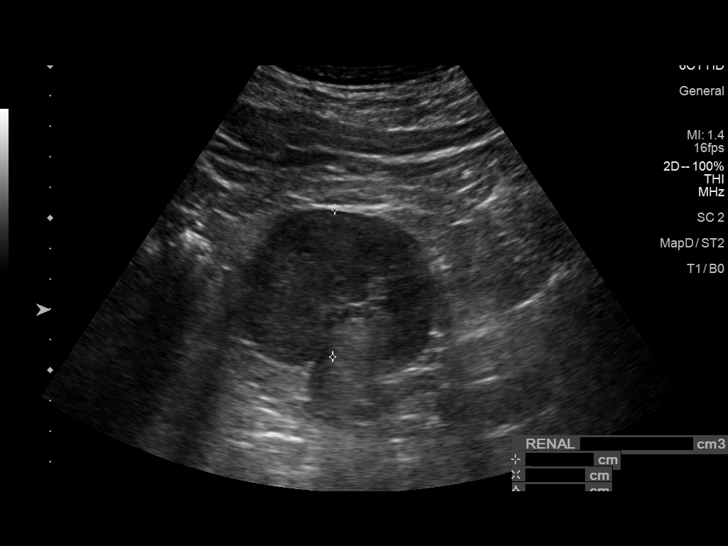
[im 19/36]
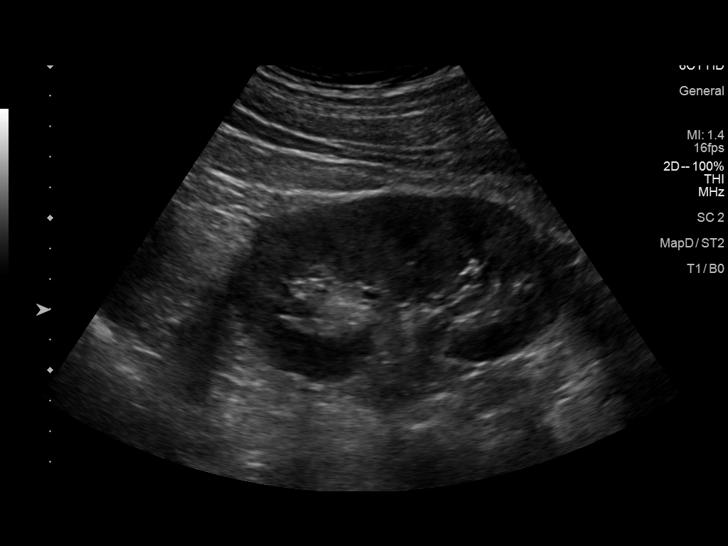
[im 22/36]
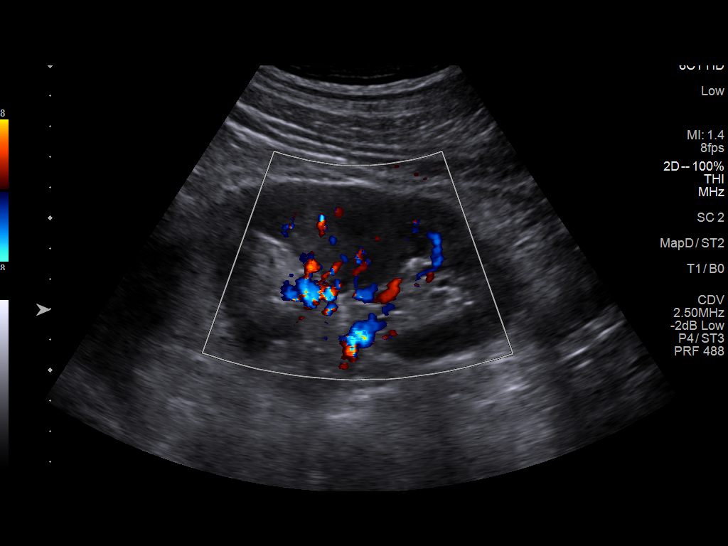
[im 24/36]
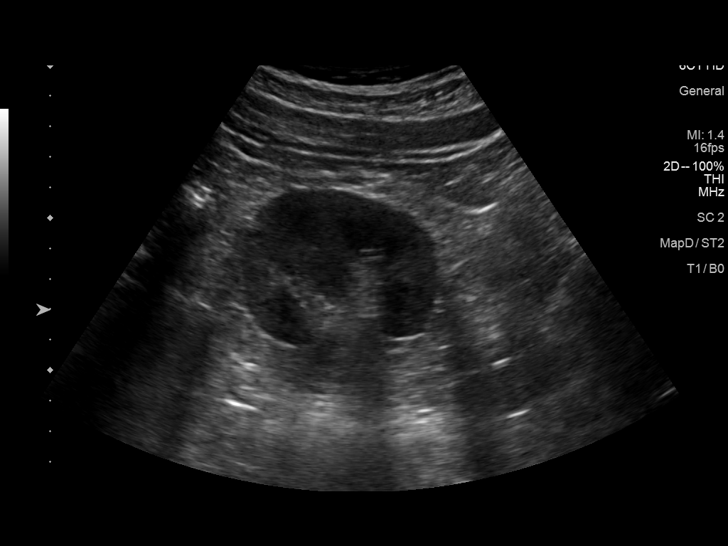
[im 27/36]
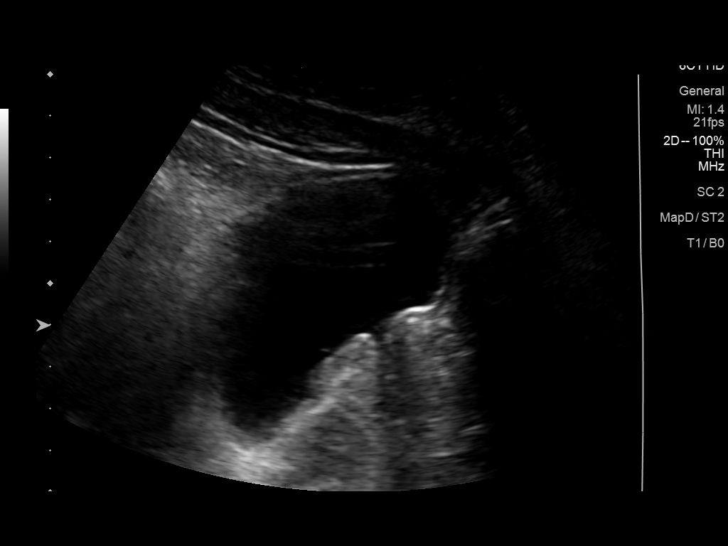
[im 30/36]
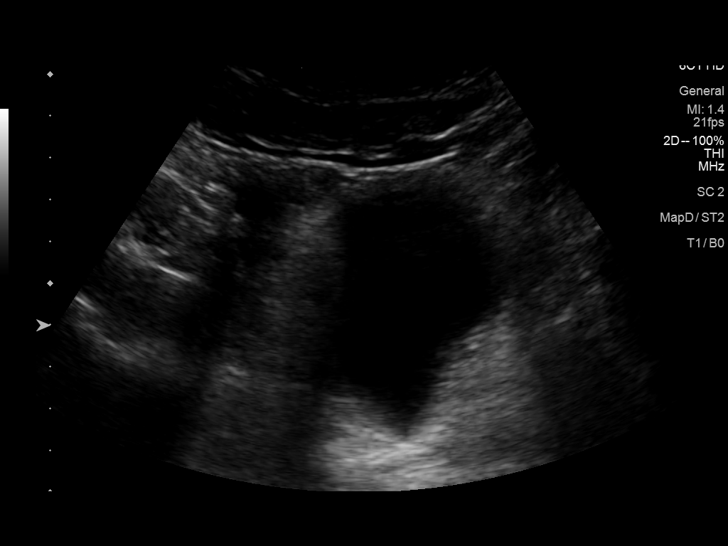
[im 33/36]
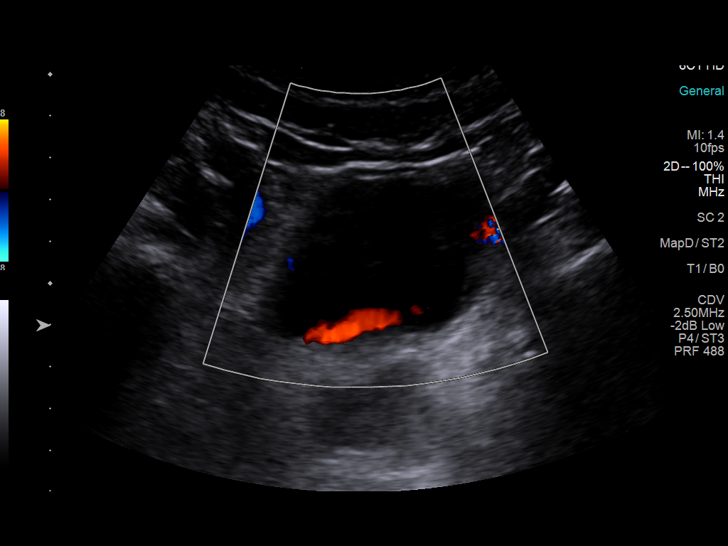
[im 36/36]
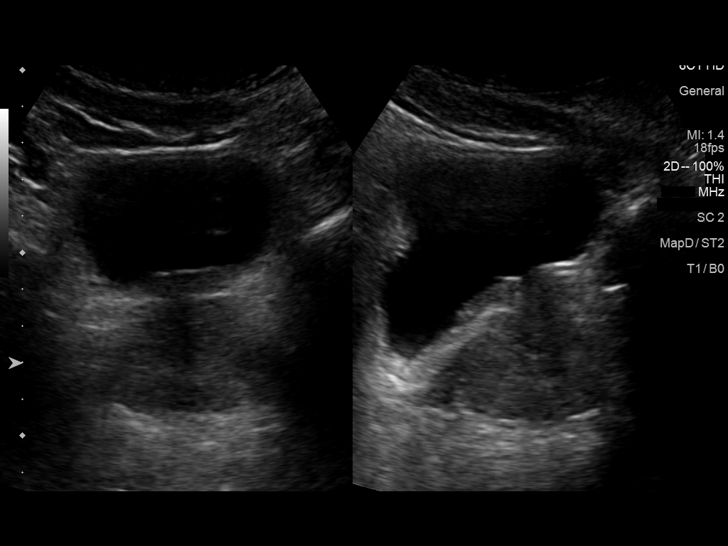

[14 of 25 positions shown; findings below may reference images not displayed]

FINDINGS: Right Kidney:

Renal measurements: 11.2 x 5.9 x 5.9 cm = volume: 201 mL. There is
no hydronephrosis. There is 6 mm right renal stone in the lower
pole. There is no focal cortical thinning.

Left Kidney:

Renal measurements: 12 x 5.3 x 4.8 cm = volume: 159 mL. Echogenicity
within normal limits. No mass or hydronephrosis visualized.

Bladder:

Appears normal for degree of bladder distention.

Other:

Prostate measures 4.6 x 4.1 x 4.7 cm.
IMPRESSION: There is no hydronephrosis. There is 6 mm nonobstructing right renal
stone.

## 2022-12-12 ENCOUNTER — Other Ambulatory Visit: Payer: Self-pay | Admitting: Internal Medicine

## 2022-12-12 ENCOUNTER — Encounter: Payer: Self-pay | Admitting: Internal Medicine

## 2022-12-12 MED ORDER — MECLIZINE HCL 25 MG PO TABS
25.0000 mg | ORAL_TABLET | Freq: Three times a day (TID) | ORAL | 3 refills | Status: AC | PRN
Start: 2022-12-12 — End: ?

## 2022-12-12 NOTE — Progress Notes (Signed)
See My Chart correspondence for details

## 2023-01-22 ENCOUNTER — Telehealth: Payer: Self-pay | Admitting: Internal Medicine

## 2023-01-22 NOTE — Telephone Encounter (Signed)
Concern: stomach pain     Onset/Duration: symptoms began yesterday    Associated Symptoms: 6/10 pain stomach cramping around belly button. Feels "like I need to have a BM but nothing comes out"    Negative For: vomiting, fever, abdominal rebound tenderness, blood/black stools    Notes: Patient reports he had two Bms yesterday, the first was regular consistency, the second was more loose. Patient is able to pass gas and pain "slightly improves" after. Patient has increased his water intake today but has not taken any laxatives or stool softeners. Mr. Kio stated he usually takes laxatives at night, but he did not taken one last night. Mr. Badenhop is unsure if symptoms are from something he ate, the only food he could pinpoint being out of the usually for him was a chorizo sausage he had yesterday morning. Patient reported he felt a bit nauseous last night, no vomiting.      Advice Given: Recommended over the counter laxative, stool softer or prune juice.     Callers Response to Advice: Patient verbalized understanding of information, stated he does not think over the counter medication "will be strong enough"    Patient advised to call back if no improvement, or condition worsens

## 2023-01-22 NOTE — Telephone Encounter (Signed)
Michael Gardner called said he is not feeling well.  His stomach bother him all night, feeling like he need to go but went to bathroom, nothing get out.  Stomach hurt.  Patient asked if he can get some medication sending to [facility] [phone] [place], [place] - [address] [place] [phone]   Informed him the nurse will be calling him to get more info.  He said ok please call him at [phone] Thanks

## 2023-03-12 ENCOUNTER — Encounter: Payer: Self-pay | Admitting: Internal Medicine

## 2023-05-01 ENCOUNTER — Telehealth: Payer: Self-pay | Admitting: Internal Medicine

## 2023-05-01 ENCOUNTER — Other Ambulatory Visit: Payer: Self-pay | Admitting: Internal Medicine

## 2023-05-01 DIAGNOSIS — R7303 Prediabetes: Secondary | ICD-10-CM

## 2023-05-01 DIAGNOSIS — D472 Monoclonal gammopathy: Secondary | ICD-10-CM

## 2023-05-01 DIAGNOSIS — E785 Hyperlipidemia, unspecified: Secondary | ICD-10-CM

## 2023-05-01 NOTE — Telephone Encounter (Signed)
Orders placed in Epic as requested

## 2023-05-01 NOTE — Telephone Encounter (Signed)
Pt's wife called made lab appt. Please put in lab orders also Pt has requested to have Protein Elecrophorsis Serum and lipid labs order put in for labs.    Thank you

## 2023-05-06 ENCOUNTER — Ambulatory Visit (INDEPENDENT_AMBULATORY_CARE_PROVIDER_SITE_OTHER): Payer: BC Managed Care – PPO

## 2023-05-06 DIAGNOSIS — R7303 Prediabetes: Secondary | ICD-10-CM

## 2023-05-06 DIAGNOSIS — D472 Monoclonal gammopathy: Secondary | ICD-10-CM

## 2023-05-06 DIAGNOSIS — E785 Hyperlipidemia, unspecified: Secondary | ICD-10-CM

## 2023-05-06 NOTE — Progress Notes (Signed)
 Blood Draw:  Fasting? = Yes  Location of stick: Left AC using aseptic technique.  Outcome: Patient tolerated well without any complications. Attempt # 1.    Collected & Processed:     5 Tiger Top/SSTs (1 Tiger top with serum separated into a clear plastic

## 2023-05-06 NOTE — Addendum Note (Signed)
 Addended by: Cordie Grice on: 05/06/2023 04:08 PM     Modules accepted: Orders

## 2023-05-07 LAB — COMPREHENSIVE METABOLIC PANEL
ALT: 19 [IU]/L (ref 0–44)
AST (SGOT): 19 [IU]/L (ref 0–40)
Albumin: 4.8 g/dL (ref 3.8–4.9)
Alkaline Phosphatase: 63 [IU]/L (ref 44–121)
BUN / Creatinine Ratio: 15 (ref 9–20)
BUN: 17 mg/dL (ref 6–24)
Bilirubin, Total: 0.4 mg/dL (ref 0.0–1.2)
CO2: 19 mmol/L — ABNORMAL LOW (ref 20–29)
Calcium: 9.4 mg/dL (ref 8.7–10.2)
Chloride: 105 mmol/L (ref 96–106)
Creatinine: 1.14 mg/dL (ref 0.76–1.27)
Globulin, Total: 2.4 g/dL (ref 1.5–4.5)
Glucose: 90 mg/dL (ref 70–99)
Potassium: 4.2 mmol/L (ref 3.5–5.2)
Protein, Total: 7.2 g/dL (ref 6.0–8.5)
Sodium: 142 mmol/L (ref 134–144)
eGFR: 75 mL/min/{1.73_m2} (ref >59)

## 2023-05-07 LAB — CBC AND DIFFERENTIAL
Baso(Absolute): 0 10*3/uL (ref 0.0–0.2)
Basophils Automated: 0 %
Eosinophils Absolute: 0.3 10*3/uL (ref 0.0–0.4)
Eosinophils Automated: 7 %
Hematocrit: 52.5 % — ABNORMAL HIGH (ref 37.5–51.0)
Hemoglobin: 16.4 g/dL (ref 13.0–17.7)
Immature Granulocytes Absolute: 0 10*3/uL (ref 0.0–0.1)
Immature Granulocytes: 1 %
Lymphocytes Absolute: 1.5 10*3/uL (ref 0.7–3.1)
Lymphocytes Automated: 39 %
MCH: 25.6 pg — ABNORMAL LOW (ref 26.6–33.0)
MCHC: 31.2 g/dL — ABNORMAL LOW (ref 31.5–35.7)
MCV: 82 fL (ref 79–97)
Monocytes Absolute: 0.3 10*3/uL (ref 0.1–0.9)
Monocytes: 9 %
Neutrophils Absolute Count: 1.7 10*3/uL (ref 1.4–7.0)
Neutrophils: 44 %
Platelets: 208 10*3/uL (ref 150–450)
RBC: 6.41 x10E6/uL — ABNORMAL HIGH (ref 4.14–5.80)
RDW: 17.3 % — ABNORMAL HIGH (ref 11.6–15.4)
WBC: 3.8 10*3/uL (ref 3.4–10.8)

## 2023-05-07 LAB — LIPID PANEL
Cholesterol / HDL Ratio: 4.9 {ratio} (ref 0.0–5.0)
Cholesterol: 153 mg/dL (ref 100–199)
HDL: 31 mg/dL — ABNORMAL LOW (ref >39)
LDL Chol Calculated (NIH): 86 mg/dL (ref 0–99)
Triglycerides: 209 mg/dL — ABNORMAL HIGH (ref 0–149)
VLDL Calculated: 36 mg/dL (ref 5–40)

## 2023-05-07 LAB — HEMOGLOBIN A1C: Hemoglobin A1C: 6.1 % — ABNORMAL HIGH (ref 4.8–5.6)

## 2023-05-07 LAB — FREE KAPPA AND LAMBDA LIGHT CHAINS WITH RATIO, QUANTITATIVE
Ig Kappa Free Light Chain: 13.3 mg/L (ref 3.3–19.4)
Ig Lambda Free Light Chain: 6.1 mg/L (ref 5.7–26.3)
Kappa/Lambda Ratio: 2.18 — ABNORMAL HIGH (ref 0.26–1.65)

## 2023-05-08 LAB — URINE IMMUNOFIXATION ELECTROPHORESIS WITH REVIEW: Immunofixation, UR: NORMAL

## 2023-05-13 LAB — SERUM IMMUNOFIXATION ELECTROPHORESIS WITH REVIEW
Albumin Electrophoresis: 4.3 g/dL (ref 2.9–4.4)
Albumin/Globulin Ratio: 1.5 (ref 0.7–1.7)
Alpha 1: 0.2 g/dL (ref 0.0–0.4)
Alpha 2: 0.7 g/dL (ref 0.4–1.0)
Beta: 0.9 g/dL (ref 0.7–1.3)
Gamma Globulin: 1.2 g/dL (ref 0.4–1.8)
Globulin, Total: 2.9 g/dL (ref 2.2–3.9)
IgM: 428 mg/dL — ABNORMAL HIGH (ref 20–172)
Immunoglobulin A: 134 mg/dL (ref 90–386)
M spike: 0.5 g/dL — ABNORMAL HIGH
Total IgG: 1004 mg/dL (ref 603–1613)

## 2023-05-17 LAB — CARDIO IQ(R) ADVANCED LIPID PANEL
Apolipoprotein B: 90 mg/dL — ABNORMAL HIGH
Cholesterol Total: 146 mg/dL (ref <200)
Cholesterol/HDL Ratio: 3.9 calc (ref <5.0)
HDL Large: 5273 nmol/L — ABNORMAL LOW (ref >6729)
HDL: 37 mg/dL — ABNORMAL LOW (ref >=40)
LDL Cholesterol, Calculated: 79 (ref <100)
LDL Medium: 147 nmol/L (ref <215)
LDL Particle Number: 1409 nmol/L — ABNORMAL HIGH (ref <1138)
LDL Peak Size: 204.6 Angstrom — ABNORMAL LOW (ref >222.9)
LDL Small: 195 nmol/L — ABNORMAL HIGH (ref <142)
Lipoprotein (a): 144 nmol/L — ABNORMAL HIGH (ref <75)
Non-HDL Cholesterol: 109 (ref <130)
Triglycerides: 200 mg/dL — ABNORMAL HIGH (ref <150)

## 2023-05-20 ENCOUNTER — Ambulatory Visit (INDEPENDENT_AMBULATORY_CARE_PROVIDER_SITE_OTHER): Payer: BC Managed Care – PPO | Admitting: Internal Medicine

## 2023-05-20 ENCOUNTER — Encounter: Payer: Self-pay | Admitting: Internal Medicine

## 2023-05-20 VITALS — BP 138/85 | HR 86 | Temp 98.4°F | Wt 186.8 lb

## 2023-05-20 DIAGNOSIS — E785 Hyperlipidemia, unspecified: Secondary | ICD-10-CM

## 2023-05-20 DIAGNOSIS — R7303 Prediabetes: Secondary | ICD-10-CM

## 2023-05-20 DIAGNOSIS — D472 Monoclonal gammopathy: Secondary | ICD-10-CM

## 2023-05-20 DIAGNOSIS — I2510 Atherosclerotic heart disease of native coronary artery without angina pectoris: Secondary | ICD-10-CM

## 2023-05-20 MED ORDER — EMPAGLIFLOZIN 25 MG PO TABS
25.0000 mg | ORAL_TABLET | Freq: Every morning | ORAL | 3 refills | Status: AC
Start: 2023-05-20 — End: ?

## 2023-05-20 NOTE — Assessment & Plan Note (Addendum)
Significant improvement in triglyceride levels.  Continue current lipid therapy for now.  Repeat lipid indices in 3 to 4 months.

## 2023-05-20 NOTE — Assessment & Plan Note (Signed)
Stable on current Jardiance regimen.  Recommend increasing Jardiance to 25 mg for full effect and to see if there is further decline in A1C, repeat A1c/CMP in 3 to 4 months.

## 2023-05-20 NOTE — Progress Notes (Signed)
[facility]  OFFICE VISIT            HPI   Chief Complaint   Patient presents with    Follow-up      HPI  Patient reports for follow-up prediabetes.  Prediabetes:  Lab Results   Component Value Date    HGBA1C 6.1 (H) 05/06/2023    Attempts to eat a low carb diet.  Usually works out with a Psychologist, educational 2x/week.  Compliant with Jardiance therapy - denies side effects.     CAD:  2022 - Prior CTA - LAD 123,  total score 123.  Calcified plaque within the  proximal and mid LAD causes minimal narrowing (less than 25%).  Denies CP, SOB, edema, orthopnea, edema.     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  He has not re-established with oncology in this area.     Hyperlipidemia on Fenofibrate/Lovaza/Atorvastatin  Compliant with multidrug lipid therapy.  Denies myalgias.     ROS   Review of Systems  General/Constitutional:   Denies Chills. Denies Fatigue. Denies Fever.   Respiratory:   Denies Cough. Denies Orthopnea. Denies Shortness of breath. Denies Wheezing.   Cardiovascular:   Denies Chest pain. Denies Chest pain with exertion.  Denies Palpitations. Denies Swelling in hands/feet.   Gastrointestinal:   Denies Abdominal pain. Denies Blood in stool. Denies Constipation. Denies Diarrhea.  Denies Heartburn. Denies Nausea. Denies Vomiting.   Neurologic:   Denies Dizziness. Denies Headache.        Vital Signs   BP 138/85 (BP Site: Left arm, Patient Position: Sitting, Cuff Size: Large)   Pulse 86   Temp 98.4 F (36.9 C) (Oral)   Wt 84.7 kg (186 lb 12.8 oz)   SpO2 96%   BMI 31.28 kg/m      Physical Exam   Physical Exam  GENERAL APPEARANCE: alert, in no acute distress, well developed  HEART: S1, S2 normal, no murmurs, rubs, gallops, regular rate and rhythm.   LUNGS: normal effort / no distress, normal breath sounds, clear to auscultation bilaterally, no wheezes, rales, rhonchi.   EXTREMITIES: no edema B/L.       Assessment/Plan     Assessment & Plan  Prediabetes  Stable on current Jardiance  regimen.  Recommend increasing Jardiance to 25 mg for full effect and to see if there is further decline in A1C, repeat A1c/CMP in 3 to 4 months.  Coronary artery disease involving native coronary artery of native heart without angina pectoris  2022 - Prior CTA - LAD 123,  total score 123.  Calcified plaque within the  proximal and mid LAD causes minimal narrowing (less than 25%).  Monoclonal gammopathy of undetermined significance  M spike stable compared to prior values.  Asymptomatic.  No evidence of anemia, renal dysfunction, or hypercalcemia.  Recommend establishing care with a local oncologist.  Oncology resource given the patient.  Hyperlipidemia, unspecified hyperlipidemia type  Significant improvement in triglyceride levels.  Continue current lipid therapy for now.  Repeat lipid indices in 3 to 4 months.         Audery Amel, MD

## 2023-05-20 NOTE — Assessment & Plan Note (Signed)
M spike stable compared to prior values.  Asymptomatic.  No evidence of anemia, renal dysfunction, or hypercalcemia.  Recommend establishing care with a local oncologist.  Oncology resource given the patient.

## 2023-05-20 NOTE — Assessment & Plan Note (Signed)
2022 - Prior CTA - LAD 123,  total score 123.  Calcified plaque within the  proximal and mid LAD causes minimal narrowing (less than 25%).

## 2023-05-25 ENCOUNTER — Encounter: Payer: Self-pay | Admitting: Internal Medicine

## 2023-05-27 ENCOUNTER — Telehealth: Payer: Self-pay

## 2023-05-27 NOTE — Telephone Encounter (Signed)
Lab results faxed to Dr Othelia Pulling [facility] [place] [facility] [place] [phone].

## 2023-06-14 ENCOUNTER — Other Ambulatory Visit: Payer: Self-pay | Admitting: Internal Medicine

## 2023-06-14 DIAGNOSIS — E785 Hyperlipidemia, unspecified: Secondary | ICD-10-CM

## 2023-06-25 ENCOUNTER — Encounter: Payer: Self-pay | Admitting: Internal Medicine

## 2023-07-21 ENCOUNTER — Other Ambulatory Visit: Payer: Self-pay | Admitting: Internal Medicine

## 2023-09-11 ENCOUNTER — Other Ambulatory Visit: Payer: Self-pay | Admitting: Internal Medicine

## 2023-09-23 ENCOUNTER — Ambulatory Visit: Payer: BC Managed Care – PPO | Admitting: Internal Medicine

## 2023-10-07 ENCOUNTER — Other Ambulatory Visit: Payer: Self-pay | Admitting: Internal Medicine
